# Patient Record
Sex: Female | Born: 1995 | Hispanic: No | Marital: Single | Smoking: Former smoker
Health system: Southern US, Community
[De-identification: ages and names within clinical notes are randomized; demographics above are authoritative.]

---
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
## 2017-01-16 ENCOUNTER — Other Ambulatory Visit: Payer: Self-pay | Admitting: Family Medicine

## 2017-01-24 ENCOUNTER — Other Ambulatory Visit: Payer: Self-pay | Admitting: Family Medicine

## 2017-02-06 ENCOUNTER — Other Ambulatory Visit: Payer: Self-pay | Admitting: Family Medicine

## 2017-02-13 ENCOUNTER — Telehealth: Payer: Self-pay | Admitting: Family Medicine

## 2017-02-13 NOTE — Telephone Encounter (Signed)
I called pt and talk to step mom to let her know that pt needs to call our office for a F/u appt with Dr.Metheney

## 2017-03-20 ENCOUNTER — Telehealth: Payer: Self-pay | Admitting: Family Medicine

## 2017-03-20 NOTE — Telephone Encounter (Signed)
Called pt and mom states she will have her to call us to schedule her f/u appt with Dr.Metheney

## 2017-03-29 ENCOUNTER — Encounter: Payer: Self-pay | Admitting: Family Medicine

## 2017-03-29 ENCOUNTER — Ambulatory Visit (INDEPENDENT_AMBULATORY_CARE_PROVIDER_SITE_OTHER): Payer: Commercial Managed Care - PPO | Admitting: Family Medicine

## 2017-03-29 ENCOUNTER — Ambulatory Visit: Payer: Managed Care, Other (non HMO) | Admitting: Family Medicine

## 2017-03-29 VITALS — BP 132/71 | HR 74 | Ht 62.0 in | Wt 120.0 lb

## 2017-03-29 DIAGNOSIS — F332 Major depressive disorder, recurrent severe without psychotic features: Secondary | ICD-10-CM

## 2017-03-29 DIAGNOSIS — F411 Generalized anxiety disorder: Secondary | ICD-10-CM | POA: Diagnosis not present

## 2017-03-29 MED ORDER — PAROXETINE HCL 30 MG PO TABS: 30.0000 mg | ORAL_TABLET | Freq: Every day | ORAL | 1 refills | Status: DC

## 2017-03-29 MED ORDER — PAROXETINE HCL 20 MG PO TABS: 20.0000 mg | ORAL_TABLET | Freq: Every day | ORAL | 1 refills | Status: DC

## 2017-03-29 NOTE — Progress Notes (Signed)
   Subjective:    Patient ID: Joy George, female    DOB: Dec 16, 1995, 21 y.o.   MRN: [id number]  HPI  Follow-up major depressive disorder and anxiety-currently on Paxil 20 mg daily. I have not seen her since June of this past year we started the medication. We were not going to refill her medication and how she made an appointment. Overall she feels like she's doing well. She said she didn't come back in because she felt the medication was working. She now has noticed a little bit of weight gain. And she says if she misses the medication for more than 2 or 3 day she'll get symptoms shocking sensations in her head which remind her then to then take her medication. She did recently start a new job at [employer] which is going to be a little bit higher stress. She has noticed some increased irritability since starting that job. She reports that overall she feels like she sleeps well and she tries to get enough rest. She did end up doing some therapy/counseling with Santina Evans carrier. She said she went until about November and then felt like she was doing better and has not been back since. She did feel like it was somewhat helpful.  Review of Systems     Objective:   Physical Exam  Constitutional: She is oriented to person, place, and time. She appears well-developed and well-nourished.  HENT:  Head: Normocephalic and atraumatic.  Cardiovascular: Normal rate, regular rhythm and normal heart sounds.   Pulmonary/Chest: Effort normal and breath sounds normal.  Neurological: She is alert and oriented to person, place, and time.  Skin: Skin is warm and dry.  Psychiatric: She has a normal mood and affect. Her behavior is normal.          Assessment & Plan:  Major depressive disorder with anxiety-PHQ 9 score of 11 and GAD 7 score of 2. We discussed options. We'll go ahead and try increasing Paxil to 30 mg daily. I like to see her back in about 3 months. Call if any palms or concerns and  certainly recheck to her therapist if she notices that she struggling more especially with her new job.

## 2017-10-03 ENCOUNTER — Ambulatory Visit: Payer: Commercial Managed Care - PPO | Admitting: Physician Assistant

## 2017-10-03 ENCOUNTER — Other Ambulatory Visit: Payer: Self-pay | Admitting: Family Medicine

## 2017-10-10 ENCOUNTER — Encounter: Payer: Self-pay | Admitting: Physician Assistant

## 2017-10-10 ENCOUNTER — Ambulatory Visit (INDEPENDENT_AMBULATORY_CARE_PROVIDER_SITE_OTHER): Payer: Commercial Managed Care - PPO | Admitting: Physician Assistant

## 2017-10-10 VITALS — BP 124/72 | HR 74 | Temp 98.3°F | Wt 123.3 lb

## 2017-10-10 DIAGNOSIS — F17210 Nicotine dependence, cigarettes, uncomplicated: Secondary | ICD-10-CM | POA: Diagnosis not present

## 2017-10-10 DIAGNOSIS — Z789 Other specified health status: Secondary | ICD-10-CM

## 2017-10-10 DIAGNOSIS — J019 Acute sinusitis, unspecified: Secondary | ICD-10-CM | POA: Diagnosis not present

## 2017-10-10 DIAGNOSIS — R05 Cough: Secondary | ICD-10-CM

## 2017-10-10 DIAGNOSIS — R058 Other specified cough: Secondary | ICD-10-CM

## 2017-10-10 MED ORDER — AZITHROMYCIN 250 MG PO TABS: ORAL_TABLET | ORAL | 0 refills | Status: DC

## 2017-10-10 MED ORDER — CETIRIZINE HCL 10 MG PO TABS: 10.0000 mg | ORAL_TABLET | Freq: Every day | ORAL | 11 refills | Status: DC

## 2017-10-10 MED ORDER — IPRATROPIUM BROMIDE 0.06 % NA SOLN: 1.0000 | Freq: Four times a day (QID) | NASAL | 0 refills | Status: DC | PRN

## 2017-10-10 NOTE — Progress Notes (Signed)
HPI:                                                                Joy OppenheimShyanne George is a 21 y.o. female who [facility] [place] today for URI symptoms  URI   This is a new problem. Episode onset: x 4 weeks. The problem has been unchanged. There has been no fever. Associated symptoms include congestion, coughing, a plugged ear sensation, rhinorrhea, sneezing and a sore throat. Pertinent negatives include no abdominal pain, ear pain, headaches, sinus pain, vomiting or wheezing. Associated symptoms comments: + chills. She has tried nothing for the symptoms.     No past medical history on file. No past surgical history on file. Social History   Tobacco Use  . Smoking status: Current Some Day Smoker    Types: Cigarettes  . Smokeless tobacco: Never Used  . Tobacco comment: 1-3 month  Substance Use Topics  . Alcohol use: Not on file   family history is not on file.  ROS: negative except as noted in the HPI  Medications: Current Outpatient Medications  Medication Sig Dispense Refill  . etonogestrel (NEXPLANON) 68 MG IMPL implant by Subdermal route.    Marland Kitchen. PARoxetine (PAXIL) 30 MG tablet Take 1 tablet (30 mg total) by mouth daily. Due for follow up visit 30 tablet 0  . azithromycin (ZITHROMAX Z-PAK) 250 MG tablet Take 2 tablets (500 mg) on  Day 1,  followed by 1 tablet (250 mg) once daily on Days 2 through 5. 6 tablet 0  . cetirizine (ZYRTEC) 10 MG tablet Take 1 tablet (10 mg total) at bedtime by mouth. 30 tablet 11  . ipratropium (ATROVENT) 0.06 % nasal spray Place 1 spray 4 (four) times daily as needed into both nostrils. 15 mL 0   No current facility-administered medications for this visit.    No Known Allergies     Objective:  BP 124/72   Pulse 74   Temp 98.3 F (36.8 C)   Wt 123 lb 5 oz (55.9 kg)   SpO2 99%   BMI 22.55 kg/m  Gen:  alert, not ill-appearing, no distress, appropriate for age HEENT: head normocephalic  without obvious abnormality, conjunctiva and cornea clear, TM's clear bilaterally, oropharynx clear, no exudates, uvula midline, nasal mucosa edematous, nares patent, no frontal or maxillary sinus tenderness, neck supple, no adenopathy, trachea midline Pulm: Normal work of breathing, normal phonation, clear to auscultation bilaterally, no wheezes, rales or rhonchi CV: Normal rate, regular rhythm, s1 and s2 distinct, no murmurs, clicks or rubs  Neuro: alert and oriented x 3, no tremor MSK: extremities atraumatic, normal gait and station Skin: intact, no rashes on exposed skin, no cyanosis   No results found for this or any previous visit (from the past 72 hour(s)). No results found.    Assessment and Plan: 21 y.o. female with   1. Acute non-recurrent sinusitis, unspecified location - 4 weeks of persistent symptoms, will treat empirically with Azithromycin to cover pulmonary symptoms. Symptomatic management with oral decongestant, intranasal decongestant/rinses - cetirizine (ZYRTEC) 10 MG tablet; Take 1 tablet (10 mg total) at bedtime by mouth.  Dispense: 30 tablet; Refill: 11 - azithromycin (ZITHROMAX Z-PAK) 250 MG tablet; Take 2 tablets (500 mg) on  Day 1,  followed by 1 tablet (250 mg) once daily on Days 2 through 5.  Dispense: 6 tablet; Refill: 0 - ipratropium (ATROVENT) 0.06 % nasal spray; Place 1 spray 4 (four) times daily as needed into both nostrils.  Dispense: 15 mL; Refill: 0  2. Electronic cigarette use   3. Light tobacco smoker <10 cigarettes per day   4. Cough present for greater than 3 weeks - limit vaping/tobacco use - lungs CTA, no fever, tachycardia or indication for chest x-ray today. She will be covered for CAP and pertussis with Azithromycin. Follow-up if cough persists.  Patient education and anticipatory guidance given Patient agrees with treatment plan Follow-up as needed if symptoms worsen or fail to improve  Levonne Hubertharley E. Teigan Manner PA-C

## 2017-10-10 NOTE — Patient Instructions (Signed)
- antibiotic x 5 days as prescribed - start Zyrtec/Cetirizine at bedtime - Atrovent nasal spray up to 4 times daily - Netty pot / nasal saline rinse - Sudafed for nasal congestion    Sinusitis, Adult Sinusitis is soreness and inflammation of your sinuses. Sinuses are hollow spaces in the bones around your face. Your sinuses are located:  Around your eyes.  In the middle of your forehead.  Behind your nose.  In your cheekbones.  Your sinuses and nasal passages are lined with a stringy fluid (mucus). Mucus normally drains out of your sinuses. When your nasal tissues become inflamed or swollen, the mucus can become trapped or blocked so air cannot flow through your sinuses. This allows bacteria, viruses, and funguses to grow, which leads to infection. Sinusitis can develop quickly and last for 7?10 days (acute) or for more than 12 weeks (chronic). Sinusitis often develops after a cold. What are the causes? This condition is caused by anything that creates swelling in the sinuses or stops mucus from draining, including:  Allergies.  Asthma.  Bacterial or viral infection.  Abnormally shaped bones between the nasal passages.  Nasal growths that contain mucus (nasal polyps).  Narrow sinus openings.  Pollutants, such as chemicals or irritants in the air.  A foreign object stuck in the nose.  A fungal infection. This is rare.  What increases the risk? The following factors may make you more likely to develop this condition:  Having allergies or asthma.  Having had a recent cold or respiratory tract infection.  Having structural deformities or blockages in your nose or sinuses.  Having a weak immune system.  Doing a lot of swimming or diving.  Overusing nasal sprays.  Smoking.  What are the signs or symptoms? The main symptoms of this condition are pain and a feeling of pressure around the affected sinuses. Other symptoms include:  Upper  toothache.  Earache.  Headache.  Bad breath.  Decreased sense of smell and taste.  A cough that may get worse at night.  Fatigue.  Fever.  Thick drainage from your nose. The drainage is often green and it may contain pus (purulent).  Stuffy nose or congestion.  Postnasal drip. This is when extra mucus collects in the throat or back of the nose.  Swelling and warmth over the affected sinuses.  Sore throat.  Sensitivity to light.  How is this diagnosed? This condition is diagnosed based on symptoms, a medical history, and a physical exam. To find out if your condition is acute or chronic, your health care provider may:  Look in your nose for signs of nasal polyps.  Tap over the affected sinus to check for signs of infection.  View the inside of your sinuses using an imaging device that has a light attached (endoscope).  If your health care provider suspects that you have chronic sinusitis, you may also:  Be tested for allergies.  Have a sample of mucus taken from your nose (nasal culture) and checked for bacteria.  Have a mucus sample examined to see if your sinusitis is related to an allergy.  If your sinusitis does not respond to treatment and it lasts longer than 8 weeks, you may have an MRI or CT scan to check your sinuses. These scans also help to determine how severe your infection is. In rare cases, a bone biopsy may be done to rule out more serious types of fungal sinus disease. How is this treated? Treatment for sinusitis depends on the  cause and whether your condition is chronic or acute. If a virus is causing your sinusitis, your symptoms will go away on their own within 10 days. You may be given medicines to relieve your symptoms, including:  Topical nasal decongestants. They shrink swollen nasal passages and let mucus drain from your sinuses.  Antihistamines. These drugs block inflammation that is triggered by allergies. This can help to ease swelling in  your nose and sinuses.  Topical nasal corticosteroids. These are nasal sprays that ease inflammation and swelling in your nose and sinuses.  Nasal saline washes. These rinses can help to get rid of thick mucus in your nose.  If your condition is caused by bacteria, you will be given an antibiotic medicine. If your condition is caused by a fungus, you will be given an antifungal medicine. Surgery may be needed to correct underlying conditions, such as narrow nasal passages. Surgery may also be needed to remove polyps. Follow these instructions at home: Medicines  Take, use, or apply over-the-counter and prescription medicines only as told by your health care provider. These may include nasal sprays.  If you were prescribed an antibiotic medicine, take it as told by your health care provider. Do not stop taking the antibiotic even if you start to feel better. Hydrate and Humidify  Drink enough water to keep your urine clear or pale yellow. Staying hydrated will help to thin your mucus.  Use a cool mist humidifier to keep the humidity level in your home above 50%.  Inhale steam for 10-15 minutes, 3-4 times a day or as told by your health care provider. You can do this in the bathroom while a hot shower is running.  Limit your exposure to cool or dry air. Rest  Rest as much as possible.  Sleep with your head raised (elevated).  Make sure to get enough sleep each night. General instructions  Apply a warm, moist washcloth to your face 3-4 times a day or as told by your health care provider. This will help with discomfort.  Wash your hands often with soap and water to reduce your exposure to viruses and other germs. If soap and water are not available, use hand sanitizer.  Do not smoke. Avoid being around people who are smoking (secondhand smoke).  Keep all follow-up visits as told by your health care provider. This is important. Contact a health care provider if:  You have a  fever.  Your symptoms get worse.  Your symptoms do not improve within 10 days. Get help right away if:  You have a severe headache.  You have persistent vomiting.  You have pain or swelling around your face or eyes.  You have vision problems.  You develop confusion.  Your neck is stiff.  You have trouble breathing. This information is not intended to replace advice given to you by your health care provider. Make sure you discuss any questions you have with your health care provider. Document Released: 11/19/2005 Document Revised: 07/15/2016 Document Reviewed: 09/14/2015 Elsevier Interactive Patient Education  2017 [employer].

## 2017-10-30 ENCOUNTER — Other Ambulatory Visit: Payer: Self-pay | Admitting: *Deleted

## 2017-10-30 DIAGNOSIS — F411 Generalized anxiety disorder: Secondary | ICD-10-CM

## 2017-10-30 DIAGNOSIS — F332 Major depressive disorder, recurrent severe without psychotic features: Secondary | ICD-10-CM

## 2017-10-30 MED ORDER — PAROXETINE HCL 30 MG PO TABS: 30.0000 mg | ORAL_TABLET | Freq: Every day | ORAL | 0 refills | Status: DC

## 2017-12-13 ENCOUNTER — Telehealth: Payer: Self-pay | Admitting: Family Medicine

## 2017-12-13 NOTE — Telephone Encounter (Signed)
Calle pt and left a message with her mom to let her know to call the office and schedule a F/u appt with Dr.Metheney Patient is due for a F/U on mood

## 2018-03-13 ENCOUNTER — Ambulatory Visit (INDEPENDENT_AMBULATORY_CARE_PROVIDER_SITE_OTHER): Payer: Commercial Managed Care - PPO | Admitting: Family Medicine

## 2018-03-13 ENCOUNTER — Encounter: Payer: Self-pay | Admitting: Family Medicine

## 2018-03-13 VITALS — BP 124/61 | HR 76 | Ht 62.0 in | Wt 123.0 lb

## 2018-03-13 DIAGNOSIS — F411 Generalized anxiety disorder: Secondary | ICD-10-CM

## 2018-03-13 DIAGNOSIS — F332 Major depressive disorder, recurrent severe without psychotic features: Secondary | ICD-10-CM

## 2018-03-13 DIAGNOSIS — M549 Dorsalgia, unspecified: Secondary | ICD-10-CM | POA: Diagnosis not present

## 2018-03-13 DIAGNOSIS — Z113 Encounter for screening for infections with a predominantly sexual mode of transmission: Secondary | ICD-10-CM

## 2018-03-13 MED ORDER — ESCITALOPRAM OXALATE 10 MG PO TABS: 10.0000 mg | ORAL_TABLET | Freq: Every day | ORAL | 1 refills | Status: DC

## 2018-03-13 NOTE — Patient Instructions (Addendum)
Cut fluoxetine in half.  Take a half tab daily for 1 week, then drop down to half tab daily every other day for 1 week.  Then stop and start the Lexapro the next day.  Recommend a trial of Aleve, 1 tab twice a day for the next 4 days.  Recommend using heat for 5-10 minutes and then doing her stretches twice a day.  If your back is not improving over the next 3-4 weeks then please come back in.

## 2018-03-13 NOTE — Progress Notes (Signed)
Subjective:    Patient ID: Joy George, female    DOB: 12/29/1995, 22 y.o.   MRN: [id number]  HPI Follow-up major depressive disorder/anxiety-only on Paxil 30 mg daily. She has been having some electric shock sensation.  She says it make her feel tired.  She reports still little interest and pleasure doing things several days of the week but denies feeling depressed or hopeless.  She does complain of low energy and difficulty concentrating as well as feeling nervous several days of the week.  She also complains of high levels of irritability and rates her symptoms is somewhat difficult.  She also complains of some mid back pain worse on the right compared to the left but mostly over the spine itself.  No recent injury or trauma.  No radiation of pain down the back or into the legs.  She does sit a lot at her job but is up and down to.  She works in a [employer], [employer] office.  She has not tried any medications and she has not tried any heat or ice for it.   Review of Systems  BP 124/61   Pulse 76   Ht 5\' 2"  (1.575 m)   Wt 123 lb (55.8 kg)   SpO2 100%   BMI 22.50 kg/m     No Known Allergies  No past medical history on file.  No past surgical history on file.  Social History   Socioeconomic History  . Marital status: Single    Spouse name: Not on file  . Number of children: Not on file  . Years of education: Not on file  . Highest education level: Not on file  Occupational History  . Not on file  Social Needs  . Financial resource strain: Not on file  . Food insecurity:    Worry: Not on file    Inability: Not on file  . Transportation needs:    Medical: Not on file    Non-medical: Not on file  Tobacco Use  . Smoking status: Current Some Day Smoker    Types: Cigarettes  . Smokeless tobacco: Never Used  . Tobacco comment: 1-3 month  Substance and Sexual Activity  . Alcohol use: Not on file  . Drug use: Not on file  . Sexual activity: Not on file  Lifestyle  . Physical  activity:    Days per week: Not on file    Minutes per session: Not on file  . Stress: Not on file  Relationships  . Social connections:    Talks on phone: Not on file    Gets together: Not on file    Attends religious service: Not on file    Active member of club or organization: Not on file    Attends meetings of clubs or organizations: Not on file    Relationship status: Not on file  . Intimate partner violence:    Fear of current or ex partner: Not on file    Emotionally abused: Not on file    Physically abused: Not on file    Forced sexual activity: Not on file  Other Topics Concern  . Not on file  Social History Narrative  . Not on file    No family history on file.  Outpatient Encounter Medications as of 20/10/2018  Medication Sig  . etonogestrel (NEXPLANON) 68 MG IMPL implant by Subdermal route.  . [DISCONTINUED] PARoxetine (PAXIL) 30 MG tablet Take 1 tablet (30 mg total) by mouth daily. LAST REFILL. PLEASE CALL  OFFICE TO SCHEDULE AN APPOINTMENT  . escitalopram (LEXAPRO) 10 MG tablet Take 1 tablet (10 mg total) by mouth daily.  . [DISCONTINUED] cetirizine (ZYRTEC) 10 MG tablet Take 1 tablet (10 mg total) at bedtime by mouth.  . [DISCONTINUED] ipratropium (ATROVENT) 0.06 % nasal spray Place 1 spray 4 (four) times daily as needed into both nostrils.   No facility-administered encounter medications on file as of 03/13/2018.          Objective:   Physical Exam  Constitutional: She is oriented to person, place, and time. She appears well-developed and well-nourished.  HENT:  Head: Normocephalic and atraumatic.  Cardiovascular: Normal rate, regular rhythm and normal heart sounds.  Pulmonary/Chest: Effort normal and breath sounds normal.  Neurological: She is alert and oriented to person, place, and time.  Skin: Skin is warm and dry.  Psychiatric: She has a normal mood and affect. Her behavior is normal.    Musculoskeletal: Normal flexion, extension, rotation and  side bending of the spine.  Nontender over the lower thoracic or lumbar spine.  Nontender over the paraspinous muscles but tender just a little bit on the right musculature of the mid back.  Negative straight leg raise.  Patellar reflexes 2+.      Assessment & Plan:  Depression/anxiety-PHQ 9 score of 14 and gad 7 score of 4.  We discussed options.  I think would be best if we just discontinue the Paxil and try something different.  The Paxil is the only medication she is tried so far.  Will start Lexapro.  Mid back pain-no recent trauma or injury.  May be secondary to prolonged sitting.  Discussed anti-inflammatory, heat, and stretches.  If not improving then please let us know and will consider further evaluation with possible x-ray.  Urine for GD/chlamy testing.

## 2018-03-14 LAB — C. TRACHOMATIS/N. GONORRHOEAE RNA
C. trachomatis RNA, TMA: NOT DETECTED
N. GONORRHOEAE RNA, TMA: NOT DETECTED

## 2018-04-04 ENCOUNTER — Other Ambulatory Visit: Payer: Self-pay | Admitting: Family Medicine

## 2018-04-24 ENCOUNTER — Ambulatory Visit: Payer: Commercial Managed Care - PPO | Admitting: Family Medicine

## 2018-05-21 ENCOUNTER — Ambulatory Visit (INDEPENDENT_AMBULATORY_CARE_PROVIDER_SITE_OTHER): Payer: Commercial Managed Care - PPO | Admitting: Family Medicine

## 2018-05-21 ENCOUNTER — Encounter: Payer: Self-pay | Admitting: Family Medicine

## 2018-05-21 VITALS — BP 125/58 | HR 91 | Ht 62.0 in | Wt 125.0 lb

## 2018-05-21 DIAGNOSIS — F411 Generalized anxiety disorder: Secondary | ICD-10-CM

## 2018-05-21 DIAGNOSIS — F332 Major depressive disorder, recurrent severe without psychotic features: Secondary | ICD-10-CM

## 2018-05-21 MED ORDER — SERTRALINE HCL 50 MG PO TABS: ORAL_TABLET | ORAL | 0 refills | Status: DC

## 2018-05-21 NOTE — Progress Notes (Signed)
   Subjective:    Patient ID: Joy George, female    DOB: 07/25/1996, 22 y.o.   MRN: [id number]  HPI    Follow up major depressive disorder and anxiety-she really is still struggling overall.  We recently discontinued her Paxil she was having some side effects from the medication and it really was not completely controlling her symptoms.  We decided to try Lexapro instead.  She is also getting electric shock sensations in her head.  She said she still got some on the Lexapro and she did try taking it before about 4 weeks but felt like her depressive thoughts were actually more intense particularly when she was by herself and it was quiet and she was able to lay down.  She feels like she is been sleeping okay but does sometimes has a hard time falling asleep because her mind is racing.  Reports feeling down depressed and hopeless nearly every day and decreased pleasure in doing things more than half the days.  She has had thoughts of being better off dead more than half the days as well.  She rates her symptoms is somewhat difficult.  She is also had problems controlling her worry and feeling a little bit more nervous and on edge.  She is willing to try a new medication.   Review of Systems     Objective:   Physical Exam  Constitutional: She is oriented to person, place, and time. She appears well-developed and well-nourished.  HENT:  Head: Normocephalic and atraumatic.  Cardiovascular: Normal rate, regular rhythm and normal heart sounds.  Pulmonary/Chest: Effort normal and breath sounds normal.  Neurological: She is alert and oriented to person, place, and time.  Skin: Skin is warm and dry.  Psychiatric: She has a normal mood and affect. Her behavior is normal.        Assessment & Plan:  MDD with ANxiety  -aggressive thoughts actually increased with Lexapro.  Side effects with Paxil.  She has not tried anything else.  So we will start with sertraline.  We will have her take a half a tab  daily for the first 6 days and then go up to whole tab.  If she is doing well and wants to increase to 2 tabs after 2 weeks she is welcome to do so but otherwise I will see her back in 1 month.  If she feels like her symptoms are not improving or she feels like she is actually emotionally feeling more depressed or worse on the medication please call us immediately.  She did sign up for my chart while she was here today.  I be happy to refer to psychiatry as we may need to consider consultation with them.  We also discussed the option of using a mood stabilizer at bedtime to help with symptoms if needed.  We could always start with some Seroquel but did warn about potential for sedation.  She wants to hold off and just try one medication at a time which I think is perfectly reasonable.

## 2018-06-13 ENCOUNTER — Other Ambulatory Visit: Payer: Self-pay | Admitting: Family Medicine

## 2018-07-12 ENCOUNTER — Other Ambulatory Visit: Payer: Self-pay | Admitting: Family Medicine

## 2018-07-18 ENCOUNTER — Telehealth: Payer: Self-pay | Admitting: Family Medicine

## 2018-07-18 NOTE — Telephone Encounter (Signed)
lvm asking pt to call back to clarify which medication she is taking.Joy George.Joy George, Joy George, CMA

## 2018-07-18 NOTE — Telephone Encounter (Signed)
Please call patient and clarify what she is doing with her medication.  We had switched her to Zoloft because she was having some irritability on Lexapro.  But we received a refill request for the Lexapro about a week ago.  We did refill it not realizing that that was an error.  So I just wanted to clarify which when she is actually taking.  And if she is not taking the Lexapro then we need to call and cancel the prescription.

## 2018-07-18 NOTE — Telephone Encounter (Signed)
Left VM for Pt to return clinic call.  

## 2018-07-25 NOTE — Telephone Encounter (Signed)
lvm asking pt to rtn call in regards to medication.Joy PacasBarkley, Joy George

## 2018-08-08 NOTE — Telephone Encounter (Signed)
Pt has not returned call. Encounter closed.Joy George, [name]

## 2018-09-03 ENCOUNTER — Encounter: Payer: Self-pay | Admitting: Physician Assistant

## 2018-09-03 ENCOUNTER — Ambulatory Visit (INDEPENDENT_AMBULATORY_CARE_PROVIDER_SITE_OTHER): Payer: Commercial Managed Care - PPO | Admitting: Physician Assistant

## 2018-09-03 VITALS — BP 122/77 | HR 76 | Temp 98.8°F | Ht 62.0 in | Wt 122.0 lb

## 2018-09-03 DIAGNOSIS — H6991 Unspecified Eustachian tube disorder, right ear: Secondary | ICD-10-CM | POA: Diagnosis not present

## 2018-09-03 DIAGNOSIS — J069 Acute upper respiratory infection, unspecified: Secondary | ICD-10-CM | POA: Diagnosis not present

## 2018-09-03 MED ORDER — METHYLPREDNISOLONE 4 MG PO TBPK: ORAL_TABLET | ORAL | 0 refills | Status: DC

## 2018-09-03 MED ORDER — FLUTICASONE PROPIONATE 50 MCG/ACT NA SUSP: 2.0000 | Freq: Every day | NASAL | 6 refills | Status: DC

## 2018-09-03 NOTE — Patient Instructions (Signed)
Eustachian Tube Dysfunction The eustachian tube connects the middle ear to the back of the nose. It regulates air pressure in the middle ear by allowing air to move between the ear and nose. It also helps to drain fluid from the middle ear space. When the eustachian tube does not function properly, air pressure, fluid, or both can build up in the middle ear. Eustachian tube dysfunction can affect one or both ears. What are the causes? This condition happens when the eustachian tube becomes blocked or cannot open normally. This may result from:  Ear infections.  Colds and other upper respiratory infections.  Allergies.  Irritation, such as from cigarette smoke or acid from the stomach coming up into the esophagus (gastroesophageal reflux).  Sudden changes in air pressure, such as from descending in an airplane.  Abnormal growths in the nose or throat, such as nasal polyps, tumors, or enlarged tissue at the back of the throat (adenoids).  What increases the risk? This condition may be more likely to develop in people who smoke and people who are overweight. Eustachian tube dysfunction may also be more likely to develop in children, especially children who have:  Certain birth defects of the mouth, such as cleft palate.  Large tonsils and adenoids.  What are the signs or symptoms? Symptoms of this condition may include:  A feeling of fullness in the ear.  Ear pain.  Clicking or popping noises in the ear.  Ringing in the ear.  Hearing loss.  Loss of balance.  Symptoms may get worse when the air pressure around you changes, such as when you travel to an area of high elevation or fly on an airplane. How is this diagnosed? This condition may be diagnosed based on:  Your symptoms.  A physical exam of your ear, nose, and throat.  Tests, such as those that measure: ? The movement of your eardrum (tympanogram). ? Your hearing (audiometry).  How is this treated? Treatment  depends on the cause and severity of your condition. If your symptoms are mild, you may be able to relieve your symptoms by moving air into ("popping") your ears. If you have symptoms of fluid in your ears, treatment may include:  Decongestants.  Antihistamines.  Nasal sprays or ear drops that contain medicines that reduce swelling (steroids).  In some cases, you may need to have a procedure to drain the fluid in your eardrum (myringotomy). In this procedure, a small tube is placed in the eardrum to:  Drain the fluid.  Restore the air in the middle ear space.  Follow these instructions at home:  Take over-the-counter and prescription medicines only as told by your health care provider.  Use techniques to help pop your ears as recommended by your health care provider. These may include: ? Chewing gum. ? Yawning. ? Frequent, forceful swallowing. ? Closing your mouth, holding your nose closed, and gently blowing as if you are trying to blow air out of your nose.  Do not do any of the following until your health care provider approves: ? Travel to high altitudes. ? Fly in airplanes. ? Work in a pressurized cabin or room. ? Scuba dive.  Keep your ears dry. Dry your ears completely after showering or bathing.  Do not smoke.  Keep all follow-up visits as told by your health care provider. This is important. Contact a health care provider if:  Your symptoms do not go away after treatment.  Your symptoms come back after treatment.  You are   unable to pop your ears.  You have: ? A fever. ? Pain in your ear. ? Pain in your head or neck. ? Fluid draining from your ear.  Your hearing suddenly changes.  You become very dizzy.  You lose your balance. This information is not intended to replace advice given to you by your health care provider. Make sure you discuss any questions you have with your health care provider. Document Released: 12/16/2015 Document Revised: 04/26/2016  Document Reviewed: 12/08/2014 Elsevier Interactive Patient Education  2018 [employer] Upper Respiratory Infection, Adult Most upper respiratory infections (URIs) are caused by a virus. A URI affects the nose, throat, and upper air passages. The most common type of URI is often called "the common cold." Follow these instructions at home:  Take medicines only as told by your doctor.  Gargle warm saltwater or take cough drops to comfort your throat as told by your doctor.  Use a warm mist humidifier or inhale steam from a shower to increase air moisture. This may make it easier to breathe.  Drink enough fluid to keep your pee (urine) clear or pale yellow.  Eat soups and other clear broths.  Have a healthy diet.  Rest as needed.  Go back to work when your fever is gone or your doctor says it is okay. ? You may need to stay home longer to avoid giving your URI to others. ? You can also wear a face mask and wash your hands often to prevent spread of the virus.  Use your inhaler more if you have asthma.  Do not use any tobacco products, including cigarettes, chewing tobacco, or electronic cigarettes. If you need help quitting, ask your doctor. Contact a doctor if:  You are getting worse, not better.  Your symptoms are not helped by medicine.  You have chills.  You are getting more short of breath.  You have brown or red mucus.  You have yellow or brown discharge from your nose.  You have pain in your face, especially when you bend forward.  You have a fever.  You have puffy (swollen) neck glands.  You have pain while swallowing.  You have white areas in the back of your throat. Get help right away if:  You have very bad or constant: ? Headache. ? Ear pain. ? Pain in your forehead, behind your eyes, and over your cheekbones (sinus pain). ? Chest pain.  You have long-lasting (chronic) lung disease and any of the following: ? Wheezing. ? Long-lasting  cough. ? Coughing up blood. ? A change in your usual mucus.  You have a stiff neck.  You have changes in your: ? Vision. ? Hearing. ? Thinking. ? Mood. This information is not intended to replace advice given to you by your health care provider. Make sure you discuss any questions you have with your health care provider. Document Released: 05/07/2008 Document Revised: 07/22/2016 Document Reviewed: 02/24/2014 Elsevier Interactive Patient Education  2018 [employer].

## 2018-09-03 NOTE — Progress Notes (Signed)
   Subjective:    Patient ID: Joy George, female    DOB: 01/24/1996, 22 y.o.   MRN: [id number]  HPI Pt is a 22 yo female who presents to the clinic with URI symptoms for the last 5 days. Her right ear has been pretty painful but over last 24 hours improved some. She did not have any sinus pressure. She does have some nasal congestion. She has a dry cough. No fever, chills, or body aches. She has taken some OTC tylenol cold and cough with some relief.   .. Active Ambulatory Problems    Diagnosis Date Noted  . ADHD 09/26/2010  . BUNION 09/26/2010  . Finger injury 04/18/2016  . 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 GAD (generalized anxiety disorder) 05/28/2016  . Social anxiety disorder 05/28/2016  . Electronic cigarette use 10/10/2017  . Light tobacco smoker <10 cigarettes per day 10/10/2017   Resolved Ambulatory Problems    Diagnosis Date Noted  . No Resolved Ambulatory Problems   No Additional Past Medical History     Review of Systems  All other systems reviewed and are negative.      Objective:   Physical Exam  Constitutional: She is oriented to person, place, and time. She appears well-developed and well-nourished.  HENT:  Head: Normocephalic and atraumatic.  Right Ear: External ear normal.  Left Ear: External ear normal.  Mouth/Throat: Oropharynx is clear and moist.  Right TM- erythematous with effusion at base from 6oclock to 9oclock.  Left TM- slightly retracted.   No sinus tenderness to palpation.   Nasal turbinates red and swollen.   Eyes: Right eye exhibits discharge. Left eye exhibits discharge.  Watery discharge.   Cardiovascular: Normal rate and regular rhythm.  Pulmonary/Chest: Effort normal and breath sounds normal. She has no wheezes.  Lymphadenopathy:    She has no cervical adenopathy.  Neurological: She is alert and oriented to person, place, and time.  Skin: No rash noted.   Psychiatric: She has a normal mood and affect. Her behavior is normal.          Assessment & Plan:  Marland KitchenMarland KitchenLivie was seen today for ear pain.  Diagnoses and all orders for this visit:  Upper respiratory tract infection, unspecified type -     fluticasone (FLONASE) 50 MCG/ACT nasal spray; Place 2 sprays into both nostrils daily.  Eustachian tube disorder, right -     methylPREDNISolone (MEDROL DOSEPAK) 4 MG TBPK tablet; Take as directed by package insert. -     fluticasone (FLONASE) 50 MCG/ACT nasal spray; Place 2 sprays into both nostrils daily.   Discussed with patient appears like symptoms are improving. Likely viral and will run its course. She does appear to have some EtD, right. Start with flonase for next 3 days 2 sprays each nostril and then if needed could start medrol dose pack. If not improving or if symptoms worsening please call back and will send antibiotic. Her right ear if does not drain will likely form infection. HO given.

## 2018-09-08 ENCOUNTER — Other Ambulatory Visit: Payer: Self-pay | Admitting: Family Medicine

## 2018-10-05 ENCOUNTER — Other Ambulatory Visit: Payer: Self-pay | Admitting: Family Medicine

## 2019-01-24 ENCOUNTER — Other Ambulatory Visit: Payer: Self-pay | Admitting: Family Medicine

## 2019-02-22 ENCOUNTER — Other Ambulatory Visit: Payer: Self-pay | Admitting: Family Medicine

## 2019-03-18 ENCOUNTER — Other Ambulatory Visit: Payer: Self-pay | Admitting: Family Medicine

## 2019-03-22 ENCOUNTER — Other Ambulatory Visit: Payer: Self-pay | Admitting: Family Medicine

## 2019-09-01 ENCOUNTER — Other Ambulatory Visit: Payer: Self-pay | Admitting: Physician Assistant

## 2019-09-01 DIAGNOSIS — J069 Acute upper respiratory infection, unspecified: Secondary | ICD-10-CM

## 2019-09-01 DIAGNOSIS — H6991 Unspecified Eustachian tube disorder, right ear: Secondary | ICD-10-CM

## 2020-10-03 ENCOUNTER — Ambulatory Visit (INDEPENDENT_AMBULATORY_CARE_PROVIDER_SITE_OTHER): Payer: Commercial Managed Care - PPO | Admitting: Family Medicine

## 2020-10-03 ENCOUNTER — Encounter: Payer: Self-pay | Admitting: Family Medicine

## 2020-10-03 VITALS — BP 119/64 | HR 75 | Ht 62.0 in | Wt 103.0 lb

## 2020-10-03 DIAGNOSIS — Z23 Encounter for immunization: Secondary | ICD-10-CM | POA: Diagnosis not present

## 2020-10-03 DIAGNOSIS — Z13220 Encounter for screening for lipoid disorders: Secondary | ICD-10-CM | POA: Diagnosis not present

## 2020-10-03 DIAGNOSIS — R634 Abnormal weight loss: Secondary | ICD-10-CM | POA: Diagnosis not present

## 2020-10-03 DIAGNOSIS — F902 Attention-deficit hyperactivity disorder, combined type: Secondary | ICD-10-CM

## 2020-10-03 DIAGNOSIS — F411 Generalized anxiety disorder: Secondary | ICD-10-CM

## 2020-10-03 MED ORDER — AMPHETAMINE-DEXTROAMPHET ER 10 MG PO CP24: 10.0000 mg | ORAL_CAPSULE | Freq: Every morning | ORAL | 0 refills | Status: DC

## 2020-10-03 NOTE — Assessment & Plan Note (Signed)
She does rate moderately on the anxiety scale.  I do think it may be worth seeing how she does with her ADHD medication and then circling back around to see what anxiety symptoms and depression symptoms she still struggling with.  I am concerned that she has had feelings of feeling down as well as thoughts of being better off dead several days a week.

## 2020-10-03 NOTE — Assessment & Plan Note (Signed)
Discussed trying to get an adequate calorie intake and some strategies around that.  Recently she has added protein bars in the diet and that is in fact how she has gained back a couple pounds I think this is a great idea also getting protein drinks then mentally she may need to look for a more full-time job that allows her to actually have a lunch instead of just a 15-minute break.  We will continue to monitor very carefully.  In addition we will do some work-up including checking her thyroid and evaluate for anemia etc.  She will try to go for labs today.

## 2020-10-03 NOTE — Progress Notes (Signed)
Established Patient Office Visit  Subjective:  Patient ID: Joy George, female    DOB: 05/20/1996  Age: 24 y.o. MRN: [id number]  CC:  Chief Complaint  Patient presents with  . ADHD    would like to restart med  . Weight Loss    pt reports that she was 120 and has dropped down 103 and it has been hard for her to maintain   . Developmental Dysplasia of the Hip    HPI Joy George presents for some concerns.  She is interested in retrying ADHD medication she says she was diagnosed in elementary school and has not been on medications since she was in elementary school she has been able to function well but is really starting to wonder if that is affecting some of her mood issues.  She is not currently taking the Lexapro for her anxiety..  She stopped it.  She does not remember what she took years ago.  Really feels like maybe a lot of the anxiety is driven by not treating her ADHD.  She does have a GAD-7 score of 14 today and rates her symptoms as somewhat difficult.  She is also concerned because she is lost about 18 pounds since the beginning of the year.  In fact she got down to about 100 pounds and new that she needed to do better.  She says part of it is is that she does not really eat breakfast, she then works a 7-hour shift at [employer] and they are only allowed a 15-minute break and so she never finishes her lunch.  She has been trying to add in some protein bars and has been able to gain about 3 pounds back.  She denies any other symptoms such as abnormal fevers, night sweats etc.  No past medical history on file.  No past surgical history on file.  No family history on file.  Social History   Socioeconomic History  . Marital status: Single    Spouse name: Not on file  . Number of children: Not on file  . Years of education: Not on file  . Highest education level: Not on file  Occupational History  . Not on file  Tobacco Use  . Smoking status: Current Some Day  Smoker    Types: E-cigarettes  . Smokeless tobacco: Never Used  . Tobacco comment: 1-3 month  Vaping Use  . Vaping Use: Every day  Substance and Sexual Activity  . Alcohol use: Not on file  . Drug use: Not on file  . Sexual activity: Not on file  Other Topics Concern  . Not on file  Social History Narrative  . Not on file   Social Determinants of Health   Financial Resource Strain:   . Difficulty of Paying Living Expenses: Not on file  Food Insecurity:   . Worried About Charity fundraiser in the Last Year: Not on file  . Ran Out of Food in the Last Year: Not on file  Transportation Needs:   . Lack of Transportation (Medical): Not on file  . Lack of Transportation (Non-Medical): Not on file  Physical Activity:   . Days of Exercise per Week: Not on file  . Minutes of Exercise per Session: Not on file  Stress:   . Feeling of Stress : Not on file  Social Connections:   . Frequency of Communication with Friends and Family: Not on file  . Frequency of Social Gatherings with Friends and Family: Not  on file  . Attends Religious Services: Not on file  . Active Member of Clubs or Organizations: Not on file  . Attends Archivist Meetings: Not on file  . Marital Status: Not on file  Intimate Partner Violence:   . Fear of Current or Ex-Partner: Not on file  . Emotionally Abused: Not on file  . Physically Abused: Not on file  . Sexually Abused: Not on file    Outpatient Medications Prior to Visit  Medication Sig Dispense Refill  . etonogestrel (NEXPLANON) 21 MG IMPL implant by Subdermal route.    Marland Kitchen escitalopram (LEXAPRO) 10 MG tablet Take 1 tablet (10 mg total) by mouth daily. [place].MUST SCHEDULE/KEEP APPOINTMENT FOR REFILLS 30 tablet 0  . fluticasone (FLONASE) 50 MCG/ACT nasal spray Place 2 sprays into both nostrils daily. 16 g 6  . methylPREDNISolone (MEDROL DOSEPAK) 4 MG TBPK tablet Take as directed by package insert. 21 tablet 0  . sertraline (ZOLOFT)  50 MG tablet TAKE 1 TABLET TWICE A DAY 180 tablet 0   No facility-administered medications prior to visit.    No Known Allergies  ROS Review of Systems    Objective:    Physical Exam Constitutional:      Appearance: She is well-developed.  HENT:     Head: Normocephalic and atraumatic.  Cardiovascular:     Rate and Rhythm: Normal rate and regular rhythm.     Heart sounds: Normal heart sounds.  Pulmonary:     Effort: Pulmonary effort is normal.     Breath sounds: Normal breath sounds.  Skin:    General: Skin is warm and dry.  Neurological:     Mental Status: She is alert and oriented to person, place, and time.  Psychiatric:        Behavior: Behavior normal.     BP 119/64   Pulse 75   Ht _0  (1.575 m)   Wt 103 lb (46.7 kg)   SpO2 100%   BMI 18.84 kg/m  Wt Readings from Last 3 Encounters:  10/03/20 103 lb (46.7 kg)  09/03/18 122 lb (55.3 kg)  05/21/18 125 lb (56.7 kg) (44 %, Z= -0.16)*   * Growth percentiles are based on CDC (Girls, 2-20 Years) data.     Health Maintenance Due  Topic Date Due  . Hepatitis C Screening  Never done  . COVID-19 Vaccine (1) Never done  . HIV Screening  Never done  . PAP-Cervical Cytology Screening  Never done  . PAP SMEAR-Modifier  Never done  . INFLUENZA VACCINE  Never done    There are no preventive care reminders to display for this patient.  No results found for: TSH No results found for: WBC, HGB, HCT, MCV, PLT No results found for: NA, K, CHLORIDE, CO2, GLUCOSE, BUN, CREATININE, BILITOT, ALKPHOS, AST, ALT, PROT, ALBUMIN, CALCIUM, ANIONGAP, EGFR, GFR No results found for: CHOL No results found for: HDL No results found for: LDLCALC No results found for: TRIG No results found for: CHOLHDL No results found for: HGBA1C    Assessment & Plan:   Problem List Items Addressed This Visit      Other   GAD (generalized anxiety disorder)    She does rate moderately on the anxiety scale.  I do think it may be worth  seeing how she does with her ADHD medication and then circling back around to see what anxiety symptoms and depression symptoms she still struggling with.  I am concerned that she has  had feelings of feeling down as well as thoughts of being better off dead several days a week.      Attention deficit hyperactivity disorder (ADHD) - Primary    We discussed trial of medication.  Again she does not member what she took years ago so we will start with Adderall did warn about potential side effects including chest pain palpitations elevation in blood pressure, insomnia.  We will see her back in 3 to 4 weeks so that we can see how she is doing and make adjustments if needed we also discussed nonstimulant medications as an alternative if needed.  We will need to very carefully watch for weight loss and she is already struggling with this.      Relevant Orders   CBC   COMPLETE METABOLIC PANEL WITH GFR   Lipid panel   TSH   Abnormal weight loss    Discussed trying to get an adequate calorie intake and some strategies around that.  Recently she has added protein bars in the diet and that is in fact how she has gained back a couple pounds I think this is a great idea also getting protein drinks then mentally she may need to look for a more full-time job that allows her to actually have a lunch instead of just a 15-minute break.  We will continue to monitor very carefully.  In addition we will do some work-up including checking her thyroid and evaluate for anemia etc.  She will try to go for labs today.      Relevant Orders   CBC   COMPLETE METABOLIC PANEL WITH GFR   Lipid panel   TSH    Other Visit Diagnoses    Need for tetanus, diphtheria, and acellular pertussis (Tdap) vaccine in patient of adolescent age or older       Relevant Orders   Tdap vaccine greater than or equal to 7yo IM (Completed)   Screening, lipid       Relevant Orders   Lipid panel      PHQ-9 score of 10 and GAD-7 score of 14,  rates both as somewhat difficult.  Meds ordered this encounter  Medications  . amphetamine-dextroamphetamine (ADDERALL XR) 10 MG 24 hr capsule    Sig: Take 1 capsule (10 mg total) by mouth in the morning.    Dispense:  30 capsule    Refill:  0    Follow-up: Return in about 3 weeks (around 10/24/2020) for New start medication/ADHD.   I spent 30 minutes on the day of the encounter to include pre-visit record review, face-to-face time with the patient and post visit ordering of test.   Beatrice Lecher, MD

## 2020-10-03 NOTE — Assessment & Plan Note (Signed)
We discussed trial of medication.  Again she does not member what she took years ago so we will start with Adderall did warn about potential side effects including chest pain palpitations elevation in blood pressure, insomnia.  We will see her back in 3 to 4 weeks so that we can see how she is doing and make adjustments if needed we also discussed nonstimulant medications as an alternative if needed.  We will need to very carefully watch for weight loss and she is already struggling with this.

## 2020-10-04 LAB — COMPLETE METABOLIC PANEL WITH GFR
AG Ratio: 1.9 (calc) (ref 1.0–2.5)
ALT: 17 U/L (ref 6–29)
AST: 18 U/L (ref 10–30)
Albumin: 5 g/dL (ref 3.6–5.1)
Alkaline phosphatase (APISO): 55 U/L (ref 31–125)
BUN: 12 mg/dL (ref 7–25)
CO2: 25 mmol/L (ref 20–32)
Calcium: 9.9 mg/dL (ref 8.6–10.2)
Chloride: 102 mmol/L (ref 98–110)
Creat: 0.91 mg/dL (ref 0.50–1.10)
GFR, Est African American: 104 mL/min/{1.73_m2} (ref >=60)
GFR, Est Non African American: 90 mL/min/{1.73_m2} (ref >=60)
Globulin: 2.7 g/dL (calc) (ref 1.9–3.7)
Glucose, Bld: 77 mg/dL (ref 65–99)
Potassium: 4.3 mmol/L (ref 3.5–5.3)
Sodium: 136 mmol/L (ref 135–146)
Total Bilirubin: 1 mg/dL (ref 0.2–1.2)
Total Protein: 7.7 g/dL (ref 6.1–8.1)

## 2020-10-04 LAB — CBC
HCT: 44.6 % (ref 35.0–45.0)
Hemoglobin: 14.8 g/dL (ref 11.7–15.5)
MCH: 31.6 pg (ref 27.0–33.0)
MCHC: 33.2 g/dL (ref 32.0–36.0)
MCV: 95.1 fL (ref 80.0–100.0)
MPV: 9.5 fL (ref 7.5–12.5)
Platelets: 304 10*3/uL (ref 140–400)
RBC: 4.69 10*6/uL (ref 3.80–5.10)
RDW: 12 % (ref 11.0–15.0)
WBC: 8.9 10*3/uL (ref 3.8–10.8)

## 2020-10-04 LAB — LIPID PANEL
Cholesterol: 193 mg/dL (ref <200)
HDL: 68 mg/dL (ref >=50)
LDL Cholesterol (Calc): 109 mg/dL (calc) — ABNORMAL HIGH
Non-HDL Cholesterol (Calc): 125 mg/dL (calc) (ref <130)
Total CHOL/HDL Ratio: 2.8 (calc) (ref <5.0)
Triglycerides: 74 mg/dL (ref <150)

## 2020-10-04 LAB — TSH: TSH: 2.21 mIU/L

## 2020-10-06 ENCOUNTER — Telehealth: Payer: Self-pay

## 2020-10-06 NOTE — Telephone Encounter (Signed)
Patient's mother called stating that prior Berkley Harvey is needed for Adderall.

## 2020-10-10 NOTE — Telephone Encounter (Signed)
PA submitted and approved through cover my meds. Pharmacy notified.

## 2020-10-24 ENCOUNTER — Ambulatory Visit: Payer: Commercial Managed Care - PPO | Admitting: Family Medicine

## 2020-11-11 ENCOUNTER — Other Ambulatory Visit: Payer: Self-pay

## 2020-11-11 MED ORDER — AMPHETAMINE-DEXTROAMPHET ER 10 MG PO CP24
10.0000 mg | ORAL_CAPSULE | Freq: Every morning | ORAL | 0 refills | Status: DC
Start: 2020-11-11 — End: 2021-01-03

## 2020-11-11 NOTE — Telephone Encounter (Signed)
Kacee has a follow up appointment on 11/29/2020.

## 2020-11-29 ENCOUNTER — Ambulatory Visit (INDEPENDENT_AMBULATORY_CARE_PROVIDER_SITE_OTHER): Payer: Commercial Managed Care - PPO

## 2020-11-29 ENCOUNTER — Other Ambulatory Visit: Payer: Self-pay | Admitting: Family Medicine

## 2020-11-29 ENCOUNTER — Encounter: Payer: Self-pay | Admitting: Family Medicine

## 2020-11-29 ENCOUNTER — Ambulatory Visit (INDEPENDENT_AMBULATORY_CARE_PROVIDER_SITE_OTHER): Payer: Commercial Managed Care - PPO | Admitting: Family Medicine

## 2020-11-29 ENCOUNTER — Other Ambulatory Visit: Payer: Self-pay

## 2020-11-29 VITALS — BP 115/58 | HR 79 | Ht 62.0 in | Wt 103.0 lb

## 2020-11-29 DIAGNOSIS — R634 Abnormal weight loss: Secondary | ICD-10-CM | POA: Diagnosis not present

## 2020-11-29 DIAGNOSIS — F902 Attention-deficit hyperactivity disorder, combined type: Secondary | ICD-10-CM | POA: Diagnosis not present

## 2020-11-29 DIAGNOSIS — M25552 Pain in left hip: Secondary | ICD-10-CM

## 2020-11-29 DIAGNOSIS — F339 Major depressive disorder, recurrent, unspecified: Secondary | ICD-10-CM

## 2020-11-29 DIAGNOSIS — M25551 Pain in right hip: Secondary | ICD-10-CM | POA: Diagnosis not present

## 2020-11-29 DIAGNOSIS — F411 Generalized anxiety disorder: Secondary | ICD-10-CM | POA: Diagnosis not present

## 2020-11-29 DIAGNOSIS — R002 Palpitations: Secondary | ICD-10-CM

## 2020-11-29 MED ORDER — SERTRALINE HCL 50 MG PO TABS: ORAL_TABLET | ORAL | 1 refills | Status: DC

## 2020-11-29 NOTE — Progress Notes (Signed)
She reports having some positive effects w/Adderall  Mood booster More motivated and focused Less brain fog More active  Improved emotional and stress management Joy in doing hobbies again  Side effects  Dizziness(standing up to fast) Numbness in toes, mostly L foot that she had surgery on Trouble w/sleeping (goes to bed around 11 PM now she's up until 12 AM or later) Spasms in deltoid area bilaterally but mostly in L that lasts about 1 min Gets a headache when she doesn't take a dose  Increased HR(decreased caffeine intake) Increased anxiety.

## 2020-11-29 NOTE — Assessment & Plan Note (Signed)
Wt is stable form last OV today.  Will address anxiety.

## 2020-11-29 NOTE — Assessment & Plan Note (Signed)
She is interested in seeing therapist counselor.  Referral placed.

## 2020-11-29 NOTE — Assessment & Plan Note (Signed)
Recommend xrays to evaluate for premature OA

## 2020-11-29 NOTE — Assessment & Plan Note (Signed)
Discussed options including changing her medication bc of side effects. BP and pulse is OK. Weight is stable.  She feels she doesn't want to change her medication at this time.  F/U in 6-8 weeks.

## 2020-11-29 NOTE — Assessment & Plan Note (Addendum)
Discussed dx. PHQ-9 score of 10 and GAD-7 score of 7 today. Rates sxs as somewhat difficult.  She is interested in restarting medicatin for her anxiety.  She has taken sertraline and Paxil int he past.  She doesn't think the paxil worked well so would like to restart zoloft.  F/U in 6 weeks.

## 2020-11-29 NOTE — Progress Notes (Signed)
Established Patient Office Visit  Subjective:  Patient ID: Joy George, female    DOB: 27-Feb-1996  Age: 24 y.o. MRN: [id number]  CC:  Chief Complaint  Patient presents with  . ADD    HPI Joy George presents for ADD.   She reports having some positive effects w/Adderall  Mood booster More motivated and focused Less brain fog More active  Improved emotional and stress management Joy in doing hobbies again  Side effects  Dizziness(standing up to fast) Numbness in toes, mostly L foot that she had surgery on Trouble w/sleeping (goes to bed around 11 PM now she's up until 12 AM or later) Spasms in deltoid area bilaterally but mostly in L that lasts about 1 min Gets a headache when she doesn't take a dose  Increased HR(decreased caffeine intake) Increased anxiety.  C/O bilat hip pain. Worse with prolonged standing. Hx of hip dysplasia as an infact. Pain is mostly over iliac crests and the groin crease area.    History reviewed. No pertinent past medical history.  History reviewed. No pertinent surgical history.  History reviewed. No pertinent family history.  Social History   Socioeconomic History  . Marital status: Single    Spouse name: Not on file  . Number of children: Not on file  . Years of education: Not on file  . Highest education level: Not on file  Occupational History  . Not on file  Tobacco Use  . Smoking status: Current Some Day Smoker    Types: E-cigarettes  . Smokeless tobacco: Never Used  . Tobacco comment: 1-3 month  Vaping Use  . Vaping Use: Every day  Substance and Sexual Activity  . Alcohol use: Not on file  . Drug use: Not on file  . Sexual activity: Not on file  Other Topics Concern  . Not on file  Social History Narrative  . Not on file   Social Determinants of Health   Financial Resource Strain: Not on file  Food Insecurity: Not on file  Transportation Needs: Not on file  Physical Activity: Not on file  Stress: Not  on file  Social Connections: Not on file  Intimate Partner Violence: Not on file    Outpatient Medications Prior to Visit  Medication Sig Dispense Refill  . amphetamine-dextroamphetamine (ADDERALL XR) 10 MG 24 hr capsule Take 1 capsule (10 mg total) by mouth in the morning. 30 capsule 0  . etonogestrel (NEXPLANON) 68 MG IMPL implant by Subdermal route.     No facility-administered medications prior to visit.    No Known Allergies  ROS Review of Systems    Objective:    Physical Exam Constitutional:      Appearance: She is well-developed and well-nourished.  HENT:     Head: Normocephalic and atraumatic.  Cardiovascular:     Rate and Rhythm: Normal rate and regular rhythm.     Heart sounds: Normal heart sounds.  Pulmonary:     Effort: Pulmonary effort is normal.     Breath sounds: Normal breath sounds.  Skin:    General: Skin is warm and dry.  Neurological:     Mental Status: She is alert and oriented to person, place, and time.  Psychiatric:        Mood and Affect: Mood and affect normal.        Behavior: Behavior normal.     BP (!) 115/58   Pulse 79   Ht 5\' 2"  (1.575 m)   Wt 103 lb (46.7 kg)  LMP 11/27/2020   SpO2 100%   BMI 18.84 kg/m  Wt Readings from Last 3 Encounters:  11/29/20 103 lb (46.7 kg)  10/03/20 103 lb (46.7 kg)  09/03/18 122 lb (55.3 kg)     Health Maintenance Due  Topic Date Due  . Hepatitis C Screening  Never done  . HIV Screening  Never done  . PAP-Cervical Cytology Screening  Never done  . PAP SMEAR-Modifier  Never done    There are no preventive care reminders to display for this patient.  Lab Results  Component Value Date   TSH 2.21 10/03/2020   Lab Results  Component Value Date   WBC 8.9 10/03/2020   HGB 14.8 10/03/2020   HCT 44.6 10/03/2020   MCV 95.1 10/03/2020   PLT 304 10/03/2020   Lab Results  Component Value Date   NA 136 10/03/2020   K 4.3 10/03/2020   CO2 25 10/03/2020   GLUCOSE 77 10/03/2020   BUN 12  10/03/2020   CREATININE 0.91 10/03/2020   BILITOT 1.0 10/03/2020   AST 18 10/03/2020   ALT 17 10/03/2020   PROT 7.7 10/03/2020   CALCIUM 9.9 10/03/2020   Lab Results  Component Value Date   CHOL 193 10/03/2020   Lab Results  Component Value Date   HDL 68 10/03/2020   Lab Results  Component Value Date   LDLCALC 109 (H) 10/03/2020   Lab Results  Component Value Date   TRIG 74 10/03/2020   Lab Results  Component Value Date   CHOLHDL 2.8 10/03/2020   No results found for: HGBA1C    Assessment & Plan:   Problem List Items Addressed This Visit      Other   GAD (generalized anxiety disorder)    Discussed dx. PHQ-9 score of 10 and GAD-7 score of 7 today. Rates sxs as somewhat difficult.  She is interested in restarting medicatin for her anxiety.  She has taken sertraline and Paxil int he past.  She doesn't think the paxil worked well so would like to restart zoloft.  F/U in 6 weeks.       Relevant Medications   sertraline (ZOLOFT) 50 MG tablet   Other Relevant Orders   Ambulatory referral to Behavioral Health   Depression, recurrent [facility])    She is interested in seeing therapist counselor.  Referral placed.       Relevant Medications   sertraline (ZOLOFT) 50 MG tablet   Bilateral hip pain    Recommend xrays to evaluate for premature OA      Relevant Orders   DG Hip Unilat W OR W/O Pelvis 2-3 Views Right   Attention deficit hyperactivity disorder (ADHD) - Primary    Discussed options including changing her medication bc of side effects. BP and pulse is OK. Weight is stable.  She feels she doesn't want to change her medication at this time.  F/U in 6-8 weeks.        Abnormal weight loss    Wt is stable form last OV today.  Will address anxiety.         Other Visit Diagnoses    Palpitations          Palpitations - avoid caffeine with the stimulant. Warned can cause palpitation, inc HR and inc BP.    Meds ordered this encounter  Medications  . sertraline  (ZOLOFT) 50 MG tablet    Sig: Take 0.5 tablets (25 mg total) by mouth daily for 10 days, THEN 1 tablet (  50 mg total) daily for 20 days.    Dispense:  30 tablet    Refill:  1    Follow-up: Return in about 4 weeks (around 12/27/2020) for New start medication.    Joy Gasser, MD

## 2020-12-22 ENCOUNTER — Other Ambulatory Visit: Payer: Self-pay | Admitting: Family Medicine

## 2020-12-22 DIAGNOSIS — F411 Generalized anxiety disorder: Secondary | ICD-10-CM

## 2020-12-27 ENCOUNTER — Ambulatory Visit: Payer: Commercial Managed Care - PPO | Admitting: Family Medicine

## 2020-12-27 NOTE — Progress Notes (Deleted)
Established Patient Office Visit  Subjective:  Patient ID: Joy George, female    DOB: January 26, 1996  Age: 25 y.o. MRN: [id number]  CC: No chief complaint on file.   HPI [name] presents for new start medication.  Here for follow-up of depression/anxiety-after last saw her for her ADD we had discussed actually restarting medication.  I was concerned that the ADD medication was actually causing some significant weight loss.  No past medical history on file.  No past surgical history on file.  No family history on file.  Social History   Socioeconomic History  . Marital status: Single    Spouse name: Not on file  . Number of children: Not on file  . Years of education: Not on file  . Highest education level: Not on file  Occupational History  . Not on file  Tobacco Use  . Smoking status: Current Some Day Smoker    Types: E-cigarettes  . Smokeless tobacco: Never Used  . Tobacco comment: 1-3 month  Vaping Use  . Vaping Use: Every day  Substance and Sexual Activity  . Alcohol use: Not on file  . Drug use: Not on file  . Sexual activity: Not on file  Other Topics Concern  . Not on file  Social History Narrative  . Not on file   Social Determinants of Health   Financial Resource Strain: Not on file  Food Insecurity: Not on file  Transportation Needs: Not on file  Physical Activity: Not on file  Stress: Not on file  Social Connections: Not on file  Intimate Partner Violence: Not on file    Outpatient Medications Prior to Visit  Medication Sig Dispense Refill  . amphetamine-dextroamphetamine (ADDERALL XR) 10 MG 24 hr capsule Take 1 capsule (10 mg total) by mouth in the morning. 30 capsule 0  . etonogestrel (NEXPLANON) 68 MG IMPL implant by Subdermal route.    . sertraline (ZOLOFT) 50 MG tablet Take 0.5 tablets (25 mg total) by mouth daily for 10 days, THEN 1 tablet (50 mg total) daily for 20 days. 30 tablet 1   No facility-administered medications prior  to visit.    No Known Allergies  ROS Review of Systems    Objective:    Physical Exam  LMP 11/27/2020  Wt Readings from Last 3 Encounters:  11/29/20 103 lb (46.7 kg)  10/03/20 103 lb (46.7 kg)  09/03/18 122 lb (55.3 kg)     Health Maintenance Due  Topic Date Due  . Hepatitis C Screening  Never done  . HIV Screening  Never done  . PAP-Cervical Cytology Screening  Never done  . PAP SMEAR-Modifier  Never done    There are no preventive care reminders to display for this patient.  Lab Results  Component Value Date   TSH 2.21 10/03/2020   Lab Results  Component Value Date   WBC 8.9 10/03/2020   HGB 14.8 10/03/2020   HCT 44.6 10/03/2020   MCV 95.1 10/03/2020   PLT 304 10/03/2020   Lab Results  Component Value Date   NA 136 10/03/2020   K 4.3 10/03/2020   CO2 25 10/03/2020   GLUCOSE 77 10/03/2020   BUN 12 10/03/2020   CREATININE 0.91 10/03/2020   BILITOT 1.0 10/03/2020   AST 18 10/03/2020   ALT 17 10/03/2020   PROT 7.7 10/03/2020   CALCIUM 9.9 10/03/2020   Lab Results  Component Value Date   CHOL 193 10/03/2020   Lab Results  Component Value Date  HDL 68 10/03/2020   Lab Results  Component Value Date   LDLCALC 109 (H) 10/03/2020   Lab Results  Component Value Date   TRIG 74 10/03/2020   Lab Results  Component Value Date   CHOLHDL 2.8 10/03/2020   No results found for: HGBA1C    Assessment & Plan:   Problem List Items Addressed This Visit      Other   GAD (generalized anxiety disorder)   Depression, recurrent (HCC)   Attention deficit hyperactivity disorder (ADHD) - Primary      No orders of the defined types were placed in this encounter.   Follow-up: No follow-ups on file.    Nani Gasser, MD

## 2021-01-02 ENCOUNTER — Ambulatory Visit (INDEPENDENT_AMBULATORY_CARE_PROVIDER_SITE_OTHER): Payer: Commercial Managed Care - PPO | Admitting: Professional

## 2021-01-02 DIAGNOSIS — F900 Attention-deficit hyperactivity disorder, predominantly inattentive type: Secondary | ICD-10-CM

## 2021-01-02 DIAGNOSIS — F411 Generalized anxiety disorder: Secondary | ICD-10-CM

## 2021-01-03 ENCOUNTER — Other Ambulatory Visit: Payer: Self-pay

## 2021-01-03 NOTE — Telephone Encounter (Signed)
Rosalita is scheduled for February 15 th.

## 2021-01-04 MED ORDER — AMPHETAMINE-DEXTROAMPHET ER 10 MG PO CP24: 10.0000 mg | ORAL_CAPSULE | Freq: Every morning | ORAL | 0 refills | Status: DC

## 2021-01-17 ENCOUNTER — Other Ambulatory Visit: Payer: Self-pay

## 2021-01-17 ENCOUNTER — Ambulatory Visit (INDEPENDENT_AMBULATORY_CARE_PROVIDER_SITE_OTHER): Payer: Commercial Managed Care - PPO | Admitting: Family Medicine

## 2021-01-17 ENCOUNTER — Encounter: Payer: Self-pay | Admitting: Family Medicine

## 2021-01-17 VITALS — BP 123/69 | HR 82 | Ht 62.0 in | Wt 104.0 lb

## 2021-01-17 DIAGNOSIS — F411 Generalized anxiety disorder: Secondary | ICD-10-CM

## 2021-01-17 DIAGNOSIS — F339 Major depressive disorder, recurrent, unspecified: Secondary | ICD-10-CM | POA: Diagnosis not present

## 2021-01-17 DIAGNOSIS — F902 Attention-deficit hyperactivity disorder, combined type: Secondary | ICD-10-CM | POA: Diagnosis not present

## 2021-01-17 MED ORDER — SERTRALINE HCL 50 MG PO TABS: 50.0000 mg | ORAL_TABLET | Freq: Every day | ORAL | 0 refills | Status: DC

## 2021-01-17 MED ORDER — AMPHETAMINE-DEXTROAMPHET ER 10 MG PO CP24: 10.0000 mg | ORAL_CAPSULE | Freq: Every morning | ORAL | 0 refills | Status: DC

## 2021-01-17 NOTE — Assessment & Plan Note (Signed)
Doing well on current regimen.  Some of the problems she was having initially such as insomnia seem to have resolved themselves.  It does still cause some suppression of her appetite but she says she has been purposely trying to make sure she is eating and getting enough protein in we will definitely continue to monitor her weight carefully.  Her weight was at least stable from December to today.  We will continue current regimen went ahead and sent her refills for March.  I will see her back in 3 months.

## 2021-01-17 NOTE — Assessment & Plan Note (Addendum)
PHQ-9 score of 4 and GAD-7 score of 3 today.  Symptoms rated does not difficult.  Significant improvement from previous PHQ-9 of 9 and GAD-7 score of 14.  Is happy with the results so far and has not had any negative symptoms we will continue current regimen and plan to follow back up in 3 months to make sure she is still doing well and that we do not need to make any adjustments.  New prescription sent to pharmacy.

## 2021-01-17 NOTE — Progress Notes (Addendum)
Established Patient Office Visit  Subjective:  Patient ID: Joy George, female    DOB: 05-22-1996  Age: 25 y.o. MRN: [id number]  CC:  Chief Complaint  Patient presents with  . ADHD  . major depressive disorder    HPI Joy George presents for   ADHD - Reports symptoms are well controlled on current regime. Denies any problems with insomnia, chest pain, palpitations, or SOB.  She still struggles with low appetite on the medication but has been purposely trying to get enough protein in her diet to maintain her weight.  Follow-up depression/anxiety-she is currently on Zoloft, that was started at the end of December.  She has been on it for a little over 6 weeks now..  Does report still some issues with low energy and trouble concentrating as well as trouble relaxing.  We had placed a referral for therapy.  She actually feels like the medication has been helpful she has not noticed any significant negative side effects.  She feels like some things have clicked in place she thinks the combination of treating her ADD and her anxiety has been really helpful.  She says she has been doing much better with time management in general as well as just her personal outlook on things has really improved.  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  Also has a Nexplanon and which has now expired and needs to be removed.  She had it placed at [facility].    History reviewed. No pertinent past medical history.  History reviewed. No pertinent surgical history.  History reviewed. No pertinent family history.  Social History   Socioeconomic History  . Marital status: Single    Spouse name: Not on file  . Number of children: Not on file  . Years of education: Not on file  . Highest education level: Not on file  Occupational History   . Not on file  Tobacco Use  . Smoking status: Current Some Day Smoker    Types: E-cigarettes  . Smokeless tobacco: Never Used  . Tobacco comment: 1-3 month  Vaping Use  . Vaping Use: Every day  Substance and Sexual Activity  . Alcohol use: Not on file  . Drug use: Not on file  . Sexual activity: Not on file  Other Topics Concern  . Not on file  Social History Narrative  . Not on file   Social Determinants of Health   Financial Resource Strain: Not on file  Food Insecurity: Not on file  Transportation Needs: Not on file  Physical Activity: Not on file  Stress: Not on file  Social Connections: Not on file  Intimate Partner Violence: Not on file    Outpatient Medications Prior to Visit  Medication Sig Dispense Refill  . amphetamine-dextroamphetamine (ADDERALL XR) 10 MG 24 hr capsule Take 1 capsule (10 mg total) by mouth in the morning. 30 capsule 0  . sertraline (ZOLOFT) 50 MG tablet Take 0.5 tablets (25 mg total) by mouth daily for 10 days, THEN 1 tablet (50 mg total) daily for 20 days. 30 tablet 1  . etonogestrel (NEXPLANON) 68 MG IMPL implant by Subdermal route.     No facility-administered medications prior to visit.    No Known Allergies  ROS Review of Systems    Objective:  Physical Exam Constitutional:      Appearance: She is well-developed and well-nourished.  HENT:     Head: Normocephalic and atraumatic.  Cardiovascular:     Rate and Rhythm: Normal rate and regular rhythm.     Heart sounds: Normal heart sounds.  Pulmonary:     Effort: Pulmonary effort is normal.     Breath sounds: Normal breath sounds.  Skin:    General: Skin is warm and dry.  Neurological:     Mental Status: She is alert and oriented to person, place, and time.  Psychiatric:        Mood and Affect: Mood and affect normal.        Behavior: Behavior normal.     BP 123/69   Pulse 82   Ht 5\' 2"  (1.575 m)   Wt 104 lb (47.2 kg)   SpO2 95%   BMI 19.02 kg/m  Wt Readings from  Last 3 Encounters:  01/17/21 104 lb (47.2 kg)  11/29/20 103 lb (46.7 kg)  10/03/20 103 lb (46.7 kg)     Health Maintenance Due  Topic Date Due  . Hepatitis C Screening  Never done  . HIV Screening  Never done  . PAP-Cervical Cytology Screening  Never done  . PAP SMEAR-Modifier  Never done    There are no preventive care reminders to display for this patient.  Lab Results  Component Value Date   TSH 2.21 10/03/2020   Lab Results  Component Value Date   WBC 8.9 10/03/2020   HGB 14.8 10/03/2020   HCT 44.6 10/03/2020   MCV 95.1 10/03/2020   PLT 304 10/03/2020   Lab Results  Component Value Date   NA 136 10/03/2020   K 4.3 10/03/2020   CO2 25 10/03/2020   GLUCOSE 77 10/03/2020   BUN 12 10/03/2020   CREATININE 0.91 10/03/2020   BILITOT 1.0 10/03/2020   AST 18 10/03/2020   ALT 17 10/03/2020   PROT 7.7 10/03/2020   CALCIUM 9.9 10/03/2020   Lab Results  Component Value Date   CHOL 193 10/03/2020   Lab Results  Component Value Date   HDL 68 10/03/2020   Lab Results  Component Value Date   LDLCALC 109 (H) 10/03/2020   Lab Results  Component Value Date   TRIG 74 10/03/2020   Lab Results  Component Value Date   CHOLHDL 2.8 10/03/2020   No results found for: HGBA1C    Assessment & Plan:   Problem List Items Addressed This Visit      Other   GAD (generalized anxiety disorder)   Relevant Medications   sertraline (ZOLOFT) 50 MG tablet   Depression, recurrent (HCC)    PHQ-9 score of 4 and GAD-7 score of 3 today.  Symptoms rated does not difficult.  Significant improvement from previous PHQ-9 of 9 and GAD-7 score of 14.  Is happy with the results so far and has not had any negative symptoms we will continue current regimen and plan to follow back up in 3 months to make sure she is still doing well and that we do not need to make any adjustments.  New prescription sent to pharmacy.      Relevant Medications   sertraline (ZOLOFT) 50 MG tablet   Attention  deficit hyperactivity disorder (ADHD) - Primary    Doing well on current regimen.  Some of the problems she was having initially such as insomnia seem to have resolved themselves.  It does still cause some suppression  of her appetite but she says she has been purposely trying to make sure she is eating and getting enough protein in we will definitely continue to monitor her weight carefully.  Her weight was at least stable from December to today.  We will continue current regimen went ahead and sent her refills for March.  I will see her back in 3 months.         Meds ordered this encounter  Medications  . sertraline (ZOLOFT) 50 MG tablet    Sig: Take 1 tablet (50 mg total) by mouth daily.    Dispense:  90 tablet    Refill:  0  . amphetamine-dextroamphetamine (ADDERALL XR) 10 MG 24 hr capsule    Sig: Take 1 capsule (10 mg total) by mouth in the morning.    Dispense:  30 capsule    Refill:  0    Follow-up: Return in about 3 months (around 04/16/2021) for Mood.    Nani Gasser, MD

## 2021-01-26 ENCOUNTER — Ambulatory Visit: Payer: Commercial Managed Care - PPO | Admitting: Professional

## 2021-02-08 ENCOUNTER — Ambulatory Visit (INDEPENDENT_AMBULATORY_CARE_PROVIDER_SITE_OTHER): Payer: Commercial Managed Care - PPO | Admitting: Professional

## 2021-02-08 DIAGNOSIS — F411 Generalized anxiety disorder: Secondary | ICD-10-CM

## 2021-02-08 DIAGNOSIS — F900 Attention-deficit hyperactivity disorder, predominantly inattentive type: Secondary | ICD-10-CM | POA: Diagnosis not present

## 2021-03-06 ENCOUNTER — Ambulatory Visit: Payer: Commercial Managed Care - PPO | Admitting: Professional

## 2021-03-24 ENCOUNTER — Other Ambulatory Visit: Payer: Self-pay

## 2021-03-24 DIAGNOSIS — F902 Attention-deficit hyperactivity disorder, combined type: Secondary | ICD-10-CM

## 2021-03-24 MED ORDER — AMPHETAMINE-DEXTROAMPHET ER 10 MG PO CP24: 10.0000 mg | ORAL_CAPSULE | Freq: Every morning | ORAL | 0 refills | Status: DC

## 2021-03-24 NOTE — Telephone Encounter (Signed)
Pt called and requested a refill for Adderall, Pt last OV on 01/17/21. Refill pended.

## 2021-04-15 ENCOUNTER — Other Ambulatory Visit: Payer: Self-pay | Admitting: Family Medicine

## 2021-04-15 DIAGNOSIS — F411 Generalized anxiety disorder: Secondary | ICD-10-CM

## 2021-04-17 ENCOUNTER — Ambulatory Visit: Payer: Commercial Managed Care - PPO | Admitting: Family Medicine

## 2021-04-26 ENCOUNTER — Ambulatory Visit (INDEPENDENT_AMBULATORY_CARE_PROVIDER_SITE_OTHER): Payer: Commercial Managed Care - PPO | Admitting: Family Medicine

## 2021-04-26 ENCOUNTER — Encounter: Payer: Self-pay | Admitting: Family Medicine

## 2021-04-26 ENCOUNTER — Other Ambulatory Visit: Payer: Self-pay

## 2021-04-26 VITALS — BP 127/67 | HR 74 | Ht 62.0 in | Wt 108.0 lb

## 2021-04-26 DIAGNOSIS — S61217A Laceration without foreign body of left little finger without damage to nail, initial encounter: Secondary | ICD-10-CM | POA: Diagnosis not present

## 2021-04-26 DIAGNOSIS — F902 Attention-deficit hyperactivity disorder, combined type: Secondary | ICD-10-CM | POA: Diagnosis not present

## 2021-04-26 MED ORDER — AMPHETAMINE-DEXTROAMPHET ER 15 MG PO CP24: 15.0000 mg | ORAL_CAPSULE | Freq: Every morning | ORAL | 0 refills | Status: DC

## 2021-04-26 MED ORDER — AMPHETAMINE-DEXTROAMPHET ER 15 MG PO CP24: 15.0000 mg | ORAL_CAPSULE | ORAL | 0 refills | Status: DC

## 2021-04-26 NOTE — Assessment & Plan Note (Signed)
Discussed options.  We will go up to 15 mg on the Adderall XR.  Monitor for appetite suppression and side effects.  Call if any problems otherwise I will see her back in 2 months so that we can make sure she is tolerating it well and check her blood pressure and pulse at that time as well her weight.

## 2021-04-26 NOTE — Progress Notes (Signed)
Established Patient Office Visit  Subjective:  Patient ID: Joy George, female    DOB: [birth year]  Age: 25 y.o. MRN: [id number]  CC:  Chief Complaint  Patient presents with  . ADHD  . Laceration    L little finger     HPI Joy George presents for  ADHD - Reports symptoms are well controlled on current regimne.  She says that she really has noticed a huge difference in her ability to track things in consecutive order instead of feeling like her mind just bounces around.  She says she still struggling a little bit with paying attention and then remembering things so she would like to try higher dose of possible denies any pronblems with insomnia, chest pain, palpitations, or SOB.    She also cut her finger with a knife while at work yesterday on her left hand on her left finger.  She says been quite painful and thought last night it was throbbing.  She washed it immediately and has been keeping it covered.  He said it did quit bleeding.  History reviewed. No pertinent past medical history.  History reviewed. No pertinent surgical history.  History reviewed. No pertinent family history.  Social History   Socioeconomic History  . Marital status: Single    Spouse name: Not on file  . Number of children: Not on file  . Years of education: Not on file  . Highest education level: Not on file  Occupational History  . Not on file  Tobacco Use  . Smoking status: Current Some Day Smoker    Types: E-cigarettes  . Smokeless tobacco: Never Used  . Tobacco comment: 1-3 month  Vaping Use  . Vaping Use: Every day  Substance and Sexual Activity  . Alcohol use: Not on file  . Drug use: Not on file  . Sexual activity: Not on file  Other Topics Concern  . Not on file  Social History Narrative  . Not on file   Social Determinants of Health   Financial Resource Strain: Not on file  Food Insecurity: Not on file  Transportation Needs: Not on file  Physical Activity: Not on file   Stress: Not on file  Social Connections: Not on file  Intimate Partner Violence: Not on file    Outpatient Medications Prior to Visit  Medication Sig Dispense Refill  . etonogestrel (NEXPLANON) 68 MG IMPL implant by Subdermal route.    . sertraline (ZOLOFT) 50 MG tablet TAKE 1 TABLET BY MOUTH EVERY DAY 90 tablet 0  . amphetamine-dextroamphetamine (ADDERALL XR) 10 MG 24 hr capsule Take 1 capsule (10 mg total) by mouth in the morning. 30 capsule 0   No facility-administered medications prior to visit.    No Known Allergies  ROS Review of Systems    Objective:    Physical Exam Constitutional:      Appearance: She is well-developed.  HENT:     Head: Normocephalic and atraumatic.  Cardiovascular:     Rate and Rhythm: Normal rate and regular rhythm.     Heart sounds: Normal heart sounds.  Pulmonary:     Effort: Pulmonary effort is normal.     Breath sounds: Normal breath sounds.  Musculoskeletal:     Comments: Horizontal laceration over the palmar side at the end of the fifth finger on the left hand.  Incision is clean dry and intact.  It is approximately 1-1/2 cm  Skin:    General: Skin is warm and dry.  Neurological:  Mental Status: She is alert and oriented to person, place, and time.  Psychiatric:        Behavior: Behavior normal.     BP 127/67   Pulse 74   Ht 5\' 2"  (1.575 m)   Wt 108 lb (49 kg)   SpO2 100%   BMI 19.75 kg/m  Wt Readings from Last 3 Encounters:  04/26/21 108 lb (49 kg)  01/17/21 104 lb (47.2 kg)  11/29/20 103 lb (46.7 kg)     Health Maintenance Due  Topic Date Due  . HIV Screening  Never done  . Hepatitis C Screening  Never done  . PAP-Cervical Cytology Screening  Never done  . PAP SMEAR-Modifier  Never done  . COVID-19 Vaccine (3 - Booster for Pfizer series) 01/09/2021    There are no preventive care reminders to display for this patient.  Lab Results  Component Value Date   TSH 2.21 10/03/2020   Lab Results  Component  Value Date   WBC 8.9 10/03/2020   HGB 14.8 10/03/2020   HCT 44.6 10/03/2020   MCV 95.1 10/03/2020   PLT 304 10/03/2020   Lab Results  Component Value Date   NA 136 10/03/2020   K 4.3 10/03/2020   CO2 25 10/03/2020   GLUCOSE 77 10/03/2020   BUN 12 10/03/2020   CREATININE 0.91 10/03/2020   BILITOT 1.0 10/03/2020   AST 18 10/03/2020   ALT 17 10/03/2020   PROT 7.7 10/03/2020   CALCIUM 9.9 10/03/2020   Lab Results  Component Value Date   CHOL 193 10/03/2020   Lab Results  Component Value Date   HDL 68 10/03/2020   Lab Results  Component Value Date   LDLCALC 109 (H) 10/03/2020   Lab Results  Component Value Date   TRIG 74 10/03/2020   Lab Results  Component Value Date   CHOLHDL 2.8 10/03/2020   No results found for: HGBA1C    Assessment & Plan:   Problem List Items Addressed This Visit      Other   Attention deficit hyperactivity disorder (ADHD) - Primary    Discussed options.  We will go up to 15 mg on the Adderall XR.  Monitor for appetite suppression and side effects.  Call if any problems otherwise I will see her back in 2 months so that we can make sure she is tolerating it well and check her blood pressure and pulse at that time as well her weight.      Relevant Medications   amphetamine-dextroamphetamine (ADDERALL XR) 15 MG 24 hr capsule    Other Visit Diagnoses    Laceration of left little finger without foreign body without damage to nail, initial encounter         Laceration-wound cleaned.  It looks clean dry and intact.  No active bleeding.  Dermabond placed over the wound.  Patient tolerated well.  Keep covered if it is going to get wet or dirty.  Meds ordered this encounter  Medications  . DISCONTD: amphetamine-dextroamphetamine (ADDERALL XR) 15 MG 24 hr capsule    Sig: Take 1 capsule by mouth in the morning.    Dispense:  30 capsule    Refill:  0  . amphetamine-dextroamphetamine (ADDERALL XR) 15 MG 24 hr capsule    Sig: Take 1 capsule by  mouth every morning.    Dispense:  30 capsule    Refill:  0  . amphetamine-dextroamphetamine (ADDERALL XR) 15 MG 24 hr capsule    Sig: Take 1  capsule by mouth in the morning.    Dispense:  30 capsule    Refill:  0    Follow-up: Return in about 2 months (around 06/26/2021) for ADHD .    Nani Gasser, MD

## 2021-06-20 ENCOUNTER — Other Ambulatory Visit: Payer: Self-pay

## 2021-06-20 DIAGNOSIS — F902 Attention-deficit hyperactivity disorder, combined type: Secondary | ICD-10-CM

## 2021-06-20 MED ORDER — AMPHETAMINE-DEXTROAMPHET ER 15 MG PO CP24: 15.0000 mg | ORAL_CAPSULE | ORAL | 0 refills | Status: DC

## 2021-06-20 NOTE — Telephone Encounter (Signed)
Pt called a asked for refill on her Adderall. Refill pended.

## 2021-06-20 NOTE — Telephone Encounter (Signed)
Meds ordered this encounter  Medications   amphetamine-dextroamphetamine (ADDERALL XR) 15 MG 24 hr capsule    Sig: Take 1 capsule by mouth every morning.    Dispense:  30 capsule    Refill:  0    

## 2021-06-26 ENCOUNTER — Ambulatory Visit: Payer: Commercial Managed Care - PPO | Admitting: Family Medicine

## 2021-06-26 NOTE — Progress Notes (Deleted)
Established Patient Office Visit  Subjective:  Patient ID: Joy George, female    DOB: 1996-07-21  Age: 25 y.o. MRN: [id number]  CC: No chief complaint on file.   HPI [name] presents for ADHD.   ADD - Reports symptoms are well controlled on current regime. Denies any problems with insomnia, chest pain, palpitations, or SOB.     No past medical history on file.  No past surgical history on file.  No family history on file.  Social History   Socioeconomic History   Marital status: Single    Spouse name: Not on file   Number of children: Not on file   Years of education: Not on file   Highest education level: Not on file  Occupational History   Not on file  Tobacco Use   Smoking status: Some Days    Types: E-cigarettes   Smokeless tobacco: Never   Tobacco comments:    1-3 month  Vaping Use   Vaping Use: Every day  Substance and Sexual Activity   Alcohol use: Not on file   Drug use: Not on file   Sexual activity: Not on file  Other Topics Concern   Not on file  Social History Narrative   Not on file   Social Determinants of Health   Financial Resource Strain: Not on file  Food Insecurity: Not on file  Transportation Needs: Not on file  Physical Activity: Not on file  Stress: Not on file  Social Connections: Not on file  Intimate Partner Violence: Not on file    Outpatient Medications Prior to Visit  Medication Sig Dispense Refill   amphetamine-dextroamphetamine (ADDERALL XR) 15 MG 24 hr capsule Take 1 capsule by mouth in the morning. 30 capsule 0   amphetamine-dextroamphetamine (ADDERALL XR) 15 MG 24 hr capsule Take 1 capsule by mouth every morning. 30 capsule 0   etonogestrel (NEXPLANON) 68 MG IMPL implant by Subdermal route.     sertraline (ZOLOFT) 50 MG tablet TAKE 1 TABLET BY MOUTH EVERY DAY 90 tablet 0   No facility-administered medications prior to visit.    No Known Allergies  ROS Review of Systems    Objective:    Physical  Exam  There were no vitals taken for this visit. Wt Readings from Last 3 Encounters:  04/26/21 108 lb (49 kg)  01/17/21 104 lb (47.2 kg)  11/29/20 103 lb (46.7 kg)     Health Maintenance Due  Topic Date Due   Pneumococcal Vaccine 51-88 Years old (1 - PCV) Never done   HIV Screening  Never done   Hepatitis C Screening  Never done   PAP-Cervical Cytology Screening  Never done   PAP SMEAR-Modifier  Never done   COVID-19 Vaccine (3 - Booster for Pfizer series) 01/09/2021    There are no preventive care reminders to display for this patient.  Lab Results  Component Value Date   TSH 2.21 10/03/2020   Lab Results  Component Value Date   WBC 8.9 10/03/2020   HGB 14.8 10/03/2020   HCT 44.6 10/03/2020   MCV 95.1 10/03/2020   PLT 304 10/03/2020   Lab Results  Component Value Date   NA 136 10/03/2020   K 4.3 10/03/2020   CO2 25 10/03/2020   GLUCOSE 77 10/03/2020   BUN 12 10/03/2020   CREATININE 0.91 10/03/2020   BILITOT 1.0 10/03/2020   AST 18 10/03/2020   ALT 17 10/03/2020   PROT 7.7 10/03/2020   CALCIUM 9.9 10/03/2020  Lab Results  Component Value Date   CHOL 193 10/03/2020   Lab Results  Component Value Date   HDL 68 10/03/2020   Lab Results  Component Value Date   LDLCALC 109 (H) 10/03/2020   Lab Results  Component Value Date   TRIG 74 10/03/2020   Lab Results  Component Value Date   CHOLHDL 2.8 10/03/2020   No results found for: HGBA1C    Assessment & Plan:   Problem List Items Addressed This Visit       Other   Attention deficit hyperactivity disorder (ADHD) - Primary    No orders of the defined types were placed in this encounter.   Follow-up: No follow-ups on file.    Nani Gasser, MD

## 2021-07-22 ENCOUNTER — Other Ambulatory Visit: Payer: Self-pay | Admitting: Family Medicine

## 2021-07-22 DIAGNOSIS — F411 Generalized anxiety disorder: Secondary | ICD-10-CM

## 2021-08-16 ENCOUNTER — Ambulatory Visit: Payer: Commercial Managed Care - PPO | Admitting: Family Medicine

## 2021-08-17 ENCOUNTER — Ambulatory Visit (INDEPENDENT_AMBULATORY_CARE_PROVIDER_SITE_OTHER): Payer: Commercial Managed Care - PPO | Admitting: Family Medicine

## 2021-08-17 ENCOUNTER — Encounter: Payer: Self-pay | Admitting: Family Medicine

## 2021-08-17 ENCOUNTER — Other Ambulatory Visit: Payer: Self-pay

## 2021-08-17 VITALS — BP 122/70 | HR 77 | Ht 62.0 in | Wt 114.0 lb

## 2021-08-17 DIAGNOSIS — F411 Generalized anxiety disorder: Secondary | ICD-10-CM

## 2021-08-17 DIAGNOSIS — F902 Attention-deficit hyperactivity disorder, combined type: Secondary | ICD-10-CM

## 2021-08-17 DIAGNOSIS — H6981 Other specified disorders of Eustachian tube, right ear: Secondary | ICD-10-CM

## 2021-08-17 DIAGNOSIS — H938X1 Other specified disorders of right ear: Secondary | ICD-10-CM | POA: Diagnosis not present

## 2021-08-17 MED ORDER — AMPHETAMINE-DEXTROAMPHET ER 15 MG PO CP24: 15.0000 mg | ORAL_CAPSULE | Freq: Every morning | ORAL | 0 refills | Status: DC

## 2021-08-17 MED ORDER — AMPHETAMINE-DEXTROAMPHET ER 15 MG PO CP24: 15.0000 mg | ORAL_CAPSULE | ORAL | 0 refills | Status: DC

## 2021-08-17 MED ORDER — FLUTICASONE PROPIONATE 50 MCG/ACT NA SUSP: 2.0000 | Freq: Every day | NASAL | 6 refills | Status: DC

## 2021-08-17 NOTE — Assessment & Plan Note (Signed)
Doing really well on her current regimen.  PHQ-9 score of 3 and GAD-7 score of 3 both improved from prior.  Rates symptoms as not difficult.  Continue with current regimen no changes today.  Follow-up in 6 months.

## 2021-08-17 NOTE — Assessment & Plan Note (Signed)
Well controlled. Continue current regimen. Follow up in  4-6 mo 

## 2021-08-17 NOTE — Progress Notes (Signed)
Established Patient Office Visit  Subjective:  Patient ID: Joy George, female    DOB: 06/24/1996  Age: 25 y.o. MRN: [id number]  CC:  Chief Complaint  Patient presents with   ADHD   Ear Fullness    R ear feels "tight"    HPI Joy George presents for ADHD follow-up.  I last saw her in May at that point we went up to 15 mg on her Adderall XR.  She was supposed to return in 2 months. She is happy with her medication.    She is also concerned because her right ear feels "tight" she says most feels like a pressure.  Is been going on for couple months.  She says it actually is better than it was overall.  She never noticed any drainage she still feels like she is hearing well.   No past medical history on file.  No past surgical history on file.  No family history on file.  Social History   Socioeconomic History   Marital status: Single    Spouse name: Not on file   Number of children: Not on file   Years of education: Not on file   Highest education level: Not on file  Occupational History   Not on file  Tobacco Use   Smoking status: Some Days    Types: E-cigarettes   Smokeless tobacco: Never   Tobacco comments:    1-3 month  Vaping Use   Vaping Use: Every day  Substance and Sexual Activity   Alcohol use: Not on file   Drug use: Not on file   Sexual activity: Not on file  Other Topics Concern   Not on file  Social History Narrative   Not on file   Social Determinants of Health   Financial Resource Strain: Not on file  Food Insecurity: Not on file  Transportation Needs: Not on file  Physical Activity: Not on file  Stress: Not on file  Social Connections: Not on file  Intimate Partner Violence: Not on file    Outpatient Medications Prior to Visit  Medication Sig Dispense Refill   etonogestrel (NEXPLANON) 68 MG IMPL implant by Subdermal route.     sertraline (ZOLOFT) 50 MG tablet TAKE 1 TABLET BY MOUTH EVERY DAY 90 tablet 0    amphetamine-dextroamphetamine (ADDERALL XR) 15 MG 24 hr capsule Take 1 capsule by mouth in the morning. 30 capsule 0   amphetamine-dextroamphetamine (ADDERALL XR) 15 MG 24 hr capsule Take 1 capsule by mouth every morning. 30 capsule 0   No facility-administered medications prior to visit.    No Known Allergies  ROS Review of Systems    Objective:    Physical Exam Constitutional:      Appearance: Normal appearance. She is well-developed.  HENT:     Head: Normocephalic and atraumatic.     Right Ear: Tympanic membrane, ear canal and external ear normal.     Left Ear: Tympanic membrane, ear canal and external ear normal.  Cardiovascular:     Rate and Rhythm: Normal rate and regular rhythm.     Heart sounds: Normal heart sounds.  Pulmonary:     Effort: Pulmonary effort is normal.     Breath sounds: Normal breath sounds.  Musculoskeletal:     Cervical back: No rigidity or tenderness.  Lymphadenopathy:     Cervical: No cervical adenopathy.  Skin:    General: Skin is warm and dry.  Neurological:     Mental Status: She is alert and  oriented to person, place, and time.  Psychiatric:        Behavior: Behavior normal.    BP 122/70   Pulse 77   Ht 5\' 2"  (1.575 m)   Wt 114 lb (51.7 kg)   SpO2 99%   BMI 20.85 kg/m  Wt Readings from Last 3 Encounters:  08/17/21 114 lb (51.7 kg)  04/26/21 108 lb (49 kg)  01/17/21 104 lb (47.2 kg)     There are no preventive care reminders to display for this patient.  There are no preventive care reminders to display for this patient.  Lab Results  Component Value Date   TSH 2.21 10/03/2020   Lab Results  Component Value Date   WBC 8.9 10/03/2020   HGB 14.8 10/03/2020   HCT 44.6 10/03/2020   MCV 95.1 10/03/2020   PLT 304 10/03/2020   Lab Results  Component Value Date   NA 136 10/03/2020   K 4.3 10/03/2020   CO2 25 10/03/2020   GLUCOSE 77 10/03/2020   BUN 12 10/03/2020   CREATININE 0.91 10/03/2020   BILITOT 1.0 10/03/2020    AST 18 10/03/2020   ALT 17 10/03/2020   PROT 7.7 10/03/2020   CALCIUM 9.9 10/03/2020   Lab Results  Component Value Date   CHOL 193 10/03/2020   Lab Results  Component Value Date   HDL 68 10/03/2020   Lab Results  Component Value Date   LDLCALC 109 (H) 10/03/2020   Lab Results  Component Value Date   TRIG 74 10/03/2020   Lab Results  Component Value Date   CHOLHDL 2.8 10/03/2020   No results found for: HGBA1C    Assessment & Plan:   Problem List Items Addressed This Visit       Other   GAD (generalized anxiety disorder)    Doing really well on her current regimen.  PHQ-9 score of 3 and GAD-7 score of 3 both improved from prior.  Rates symptoms as not difficult.  Continue with current regimen no changes today.  Follow-up in 6 months.      Attention deficit hyperactivity disorder (ADHD)    Well controlled. Continue current regimen. Follow up in  4-6 mo      Relevant Medications   amphetamine-dextroamphetamine (ADDERALL XR) 15 MG 24 hr capsule (Start on 10/15/2021)   amphetamine-dextroamphetamine (ADDERALL XR) 15 MG 24 hr capsule (Start on 09/15/2021)   amphetamine-dextroamphetamine (ADDERALL XR) 15 MG 24 hr capsule   Other Visit Diagnoses     Dysfunction of right eustachian tube    -  Primary   Relevant Medications   fluticasone (FLONASE) 50 MCG/ACT nasal spray   Ear pressure, right           Right eustachian tube dysfunction-exam is clear.  Tympanometry is also normal.  Gave reassurance recommend a trial of nasal steroid spray if not improving over the next 2 weeks then please let 09/17/2021 know.  Meds ordered this encounter  Medications   amphetamine-dextroamphetamine (ADDERALL XR) 15 MG 24 hr capsule    Sig: Take 1 capsule by mouth in the morning.    Dispense:  30 capsule    Refill:  0   amphetamine-dextroamphetamine (ADDERALL XR) 15 MG 24 hr capsule    Sig: Take 1 capsule by mouth every morning.    Dispense:  30 capsule    Refill:  0    amphetamine-dextroamphetamine (ADDERALL XR) 15 MG 24 hr capsule    Sig: Take 1 capsule by mouth every morning.  Dispense:  30 capsule    Refill:  0   fluticasone (FLONASE) 50 MCG/ACT nasal spray    Sig: Place 2 sprays into both nostrils daily.    Dispense:  16 g    Refill:  6    Follow-up: Return in about 4 months (around 12/17/2021) for ADD medication .    Nani Gasser, MD

## 2021-12-06 ENCOUNTER — Other Ambulatory Visit: Payer: Self-pay | Admitting: Family Medicine

## 2021-12-06 DIAGNOSIS — F902 Attention-deficit hyperactivity disorder, combined type: Secondary | ICD-10-CM

## 2021-12-07 MED ORDER — AMPHETAMINE-DEXTROAMPHET ER 15 MG PO CP24: 15.0000 mg | ORAL_CAPSULE | Freq: Every morning | ORAL | 0 refills | Status: DC

## 2021-12-11 NOTE — Telephone Encounter (Deleted)
Medication: amphetamine-dextroamphetamine (ADDERALL XR) 15 MG 24 hr capsule Prior authorization submitted via CoverMyMeds on 12/11/2021 [place] submission pending

## 2021-12-11 NOTE — Telephone Encounter (Signed)
This encounter was created in error - please disregard.

## 2021-12-13 ENCOUNTER — Telehealth: Payer: Self-pay

## 2021-12-13 NOTE — Telephone Encounter (Signed)
Medication: amphetamine-dextroamphetamine (ADDERALL XR) 15 MG 24 hr capsule Prior authorization needed due to age restriction Prior authorization submitted via CoverMyMeds on 12/12/2021 [place] submission pending

## 2021-12-13 NOTE — Telephone Encounter (Signed)
Medication: amphetamine-dextroamphetamine (ADDERALL XR) 15 MG 24 hr capsule Prior authorization determination received Medication has been approved Approval dates: 12/12/2021-12/12/2024  Patient aware via: [place] aware: Yes Provider aware via this encounter

## 2021-12-18 ENCOUNTER — Other Ambulatory Visit: Payer: Self-pay

## 2021-12-18 ENCOUNTER — Encounter: Payer: Self-pay | Admitting: Family Medicine

## 2021-12-18 ENCOUNTER — Ambulatory Visit (INDEPENDENT_AMBULATORY_CARE_PROVIDER_SITE_OTHER): Payer: Commercial Managed Care - PPO | Admitting: Family Medicine

## 2021-12-18 VITALS — BP 118/72 | HR 69 | Resp 16 | Ht 62.0 in | Wt 117.0 lb

## 2021-12-18 DIAGNOSIS — F902 Attention-deficit hyperactivity disorder, combined type: Secondary | ICD-10-CM

## 2021-12-18 DIAGNOSIS — F411 Generalized anxiety disorder: Secondary | ICD-10-CM

## 2021-12-18 DIAGNOSIS — F339 Major depressive disorder, recurrent, unspecified: Secondary | ICD-10-CM | POA: Diagnosis not present

## 2021-12-18 NOTE — Assessment & Plan Note (Signed)
Overall she is doing well.  She still has a few symptoms of feeling down and a few symptoms of anxiety but overall she is happy with her current regimen.

## 2021-12-18 NOTE — Assessment & Plan Note (Signed)
He does notice that occasionally if she misses her dose for couple days she will get some electrical shock sensations.  We discussed some strategies around taking an extra pill with her today as a backup so if she starts to notice symptoms are realized that she forgot to take her medications that she can take it as soon as possible.

## 2021-12-18 NOTE — Progress Notes (Signed)
Established Patient Office Visit  Subjective:  Patient ID: Joy George, female    DOB: 12-01-1996  Age: 26 y.o. MRN: [id number]  CC:  Chief Complaint  Patient presents with   ADD    Follow up. Patient has been off of Adderall for 1 month due to cost.      HPI Joy George presents for MDD  ADHD - Reports symptoms are well controlled on current regime. Denies any problems with insomnia, chest pain, palpitations, or SOB.  She has been out of her medication for a while because she has not been able to afford it.  The price jumped up to over $100 recently.  She is not sure why.  She just says her parents went to pick it up so she is not clear.  She has been happy with her medication.   History reviewed. No pertinent past medical history.  History reviewed. No pertinent surgical history.  History reviewed. No pertinent family history.  Social History   Socioeconomic History   Marital status: Single    Spouse name: Not on file   Number of children: Not on file   Years of education: Not on file   Highest education level: Not on file  Occupational History   Not on file  Tobacco Use   Smoking status: Former    Types: E-cigarettes   Smokeless tobacco: Current   Tobacco comments:    Vape   Vaping Use   Vaping Use: Every day  Substance and Sexual Activity   Alcohol use: Not on file   Drug use: Not on file   Sexual activity: Not on file  Other Topics Concern   Not on file  Social History Narrative   Not on file   Social Determinants of Health   Financial Resource Strain: Not on file  Food Insecurity: Not on file  Transportation Needs: Not on file  Physical Activity: Not on file  Stress: Not on file  Social Connections: Not on file  Intimate Partner Violence: Not on file    Outpatient Medications Prior to Visit  Medication Sig Dispense Refill   etonogestrel (NEXPLANON) 68 MG IMPL implant by Subdermal route.     fluticasone (FLONASE) 50 MCG/ACT nasal spray  Place 2 sprays into both nostrils daily. 16 George 6   sertraline (ZOLOFT) 50 MG tablet TAKE 1 TABLET BY MOUTH EVERY DAY 90 tablet 0   amphetamine-dextroamphetamine (ADDERALL XR) 15 MG 24 hr capsule Take 1 capsule by mouth every morning. (Patient not taking: Reported on 12/18/2021) 30 capsule 0   amphetamine-dextroamphetamine (ADDERALL XR) 15 MG 24 hr capsule Take 1 capsule by mouth every morning. (Patient not taking: Reported on 12/18/2021) 30 capsule 0   amphetamine-dextroamphetamine (ADDERALL XR) 15 MG 24 hr capsule Take 1 capsule by mouth in the morning. (Patient not taking: Reported on 12/18/2021) 30 capsule 0   No facility-administered medications prior to visit.    No Known Allergies  ROS Review of Systems    Objective:    Physical Exam Constitutional:      Appearance: Normal appearance. She is well-developed.  HENT:     Head: Normocephalic and atraumatic.  Cardiovascular:     Rate and Rhythm: Normal rate and regular rhythm.     Heart sounds: Normal heart sounds.  Pulmonary:     Effort: Pulmonary effort is normal.     Breath sounds: Normal breath sounds.  Skin:    General: Skin is warm and dry.  Neurological:  Mental Status: She is alert and oriented to person, place, and time.  Psychiatric:        Behavior: Behavior normal.    BP 118/72    Pulse 69    Resp 16    Ht 5\' 2"  (1.575 m)    Wt 117 lb (53.1 kg)    SpO2 100%    BMI 21.40 kg/m  Wt Readings from Last 3 Encounters:  12/18/21 117 lb (53.1 kg)  08/17/21 114 lb (51.7 kg)  04/26/21 108 lb (49 kg)     There are no preventive care reminders to display for this patient.  There are no preventive care reminders to display for this patient.  Lab Results  Component Value Date   TSH 2.21 10/03/2020   Lab Results  Component Value Date   WBC 8.9 10/03/2020   HGB 14.8 10/03/2020   HCT 44.6 10/03/2020   MCV 95.1 10/03/2020   PLT 304 10/03/2020   Lab Results  Component Value Date   NA 136 10/03/2020   K 4.3  10/03/2020   CO2 25 10/03/2020   GLUCOSE 77 10/03/2020   BUN 12 10/03/2020   CREATININE 0.91 10/03/2020   BILITOT 1.0 10/03/2020   AST 18 10/03/2020   ALT 17 10/03/2020   PROT 7.7 10/03/2020   CALCIUM 9.9 10/03/2020   Lab Results  Component Value Date   CHOL 193 10/03/2020   Lab Results  Component Value Date   HDL 68 10/03/2020   Lab Results  Component Value Date   LDLCALC 109 (H) 10/03/2020   Lab Results  Component Value Date   TRIG 74 10/03/2020   Lab Results  Component Value Date   CHOLHDL 2.8 10/03/2020   No results found for: HGBA1C    Assessment & Plan:   Problem List Items Addressed This Visit       Other   GAD (generalized anxiety disorder)    He does notice that occasionally if she misses her dose for couple days she will get some electrical shock sensations.  We discussed some strategies around taking an extra pill with her today as a backup so if she starts to notice symptoms are realized that she forgot to take her medications that she can take it as soon as possible.      Depression, recurrent (HCC)    Overall she is doing well.  She still has a few symptoms of feeling down and a few symptoms of anxiety but overall she is happy with her current regimen.      Attention deficit hyperactivity disorder (ADHD) - Primary    Discussed options.  It may be that she has a deductible, it may be that medication is no longer covered and found that a little unusual because it is generic.  She did switch her and's plan so that certainly may be contributing they may have even possibly ran her old card and not her new.  Encouraged her to talk with the pharmacy and even call her insurance to see if she may have a deductible.  The short acting twice a day may be more cost effective if we need to switch.  Or if the insurance will cover something like Vyvanse which is very similar to extended release Adderall we could certainly change that as well.  She will try to check into  it over the next couple of days and give 13/12/2019 a call or send [place] a MyChart and let us know.  No orders of the defined types were placed in this encounter.   Follow-up: Return in about 6 months (around 06/17/2022) for ADHD, Or sooner in 2 months if we have to change you medication .    Joy Gasseratherine Ellese Julius, MD

## 2021-12-18 NOTE — Assessment & Plan Note (Signed)
Discussed options.  It may be that she has a deductible, it may be that medication is no longer covered and found that a little unusual because it is generic.  She did switch her and's plan so that certainly may be contributing they may have even possibly ran her old card and not her new.  Encouraged her to talk with the pharmacy and even call her insurance to see if she may have a deductible.  The short acting twice a day may be more cost effective if we need to switch.  Or if the insurance will cover something like Vyvanse which is very similar to extended release Adderall we could certainly change that as well.  She will try to check into it over the next couple of days and give [place] a call or send [place] a MyChart and let us know.

## 2022-01-20 ENCOUNTER — Other Ambulatory Visit: Payer: Self-pay | Admitting: Family Medicine

## 2022-01-20 DIAGNOSIS — F902 Attention-deficit hyperactivity disorder, combined type: Secondary | ICD-10-CM

## 2022-01-22 MED ORDER — AMPHETAMINE-DEXTROAMPHET ER 15 MG PO CP24: 15.0000 mg | ORAL_CAPSULE | Freq: Every morning | ORAL | 0 refills | Status: DC

## 2022-01-24 ENCOUNTER — Other Ambulatory Visit: Payer: Self-pay | Admitting: Family Medicine

## 2022-01-24 DIAGNOSIS — F411 Generalized anxiety disorder: Secondary | ICD-10-CM

## 2022-02-15 ENCOUNTER — Other Ambulatory Visit: Payer: Self-pay | Admitting: Family Medicine

## 2022-03-02 ENCOUNTER — Other Ambulatory Visit: Payer: Self-pay | Admitting: Family Medicine

## 2022-03-02 DIAGNOSIS — F902 Attention-deficit hyperactivity disorder, combined type: Secondary | ICD-10-CM

## 2022-03-02 MED ORDER — AMPHETAMINE-DEXTROAMPHET ER 15 MG PO CP24: 15.0000 mg | ORAL_CAPSULE | Freq: Every morning | ORAL | 0 refills | Status: DC

## 2022-03-15 ENCOUNTER — Other Ambulatory Visit: Payer: Self-pay | Admitting: Family Medicine

## 2022-03-15 DIAGNOSIS — F902 Attention-deficit hyperactivity disorder, combined type: Secondary | ICD-10-CM

## 2022-03-19 ENCOUNTER — Other Ambulatory Visit: Payer: Self-pay | Admitting: Family Medicine

## 2022-03-19 DIAGNOSIS — F902 Attention-deficit hyperactivity disorder, combined type: Secondary | ICD-10-CM

## 2022-03-19 MED ORDER — AMPHETAMINE-DEXTROAMPHET ER 15 MG PO CP24: 15.0000 mg | ORAL_CAPSULE | Freq: Every morning | ORAL | 0 refills | Status: DC

## 2022-04-26 IMAGING — DX DG HIP (WITH OR WITHOUT PELVIS) 5+V BILAT
5 series · 5 of 5 positions shown · non-contrast
Comparison: None.

CLINICAL DATA: Pain

EXAM:
DG HIP (WITH OR WITHOUT PELVIS) 4+V BILAT

[pelvis ap]
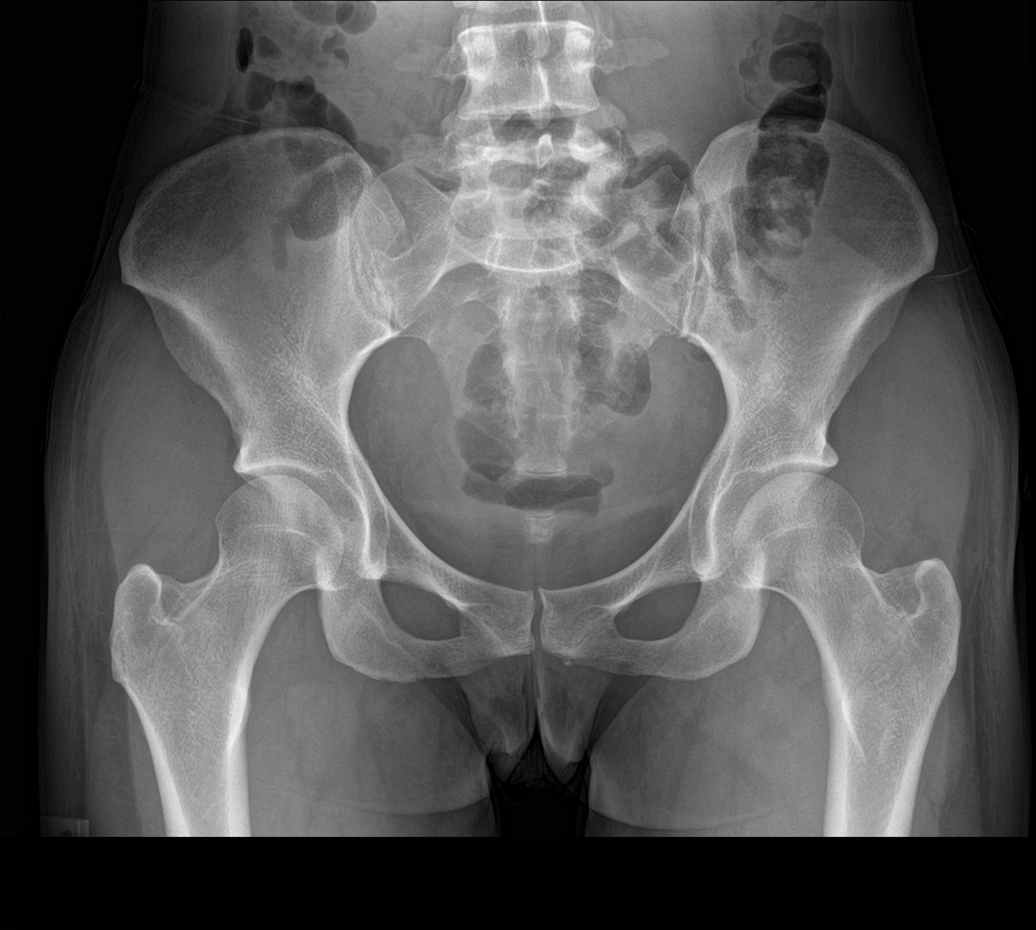

[hip ap (1 of 2)]
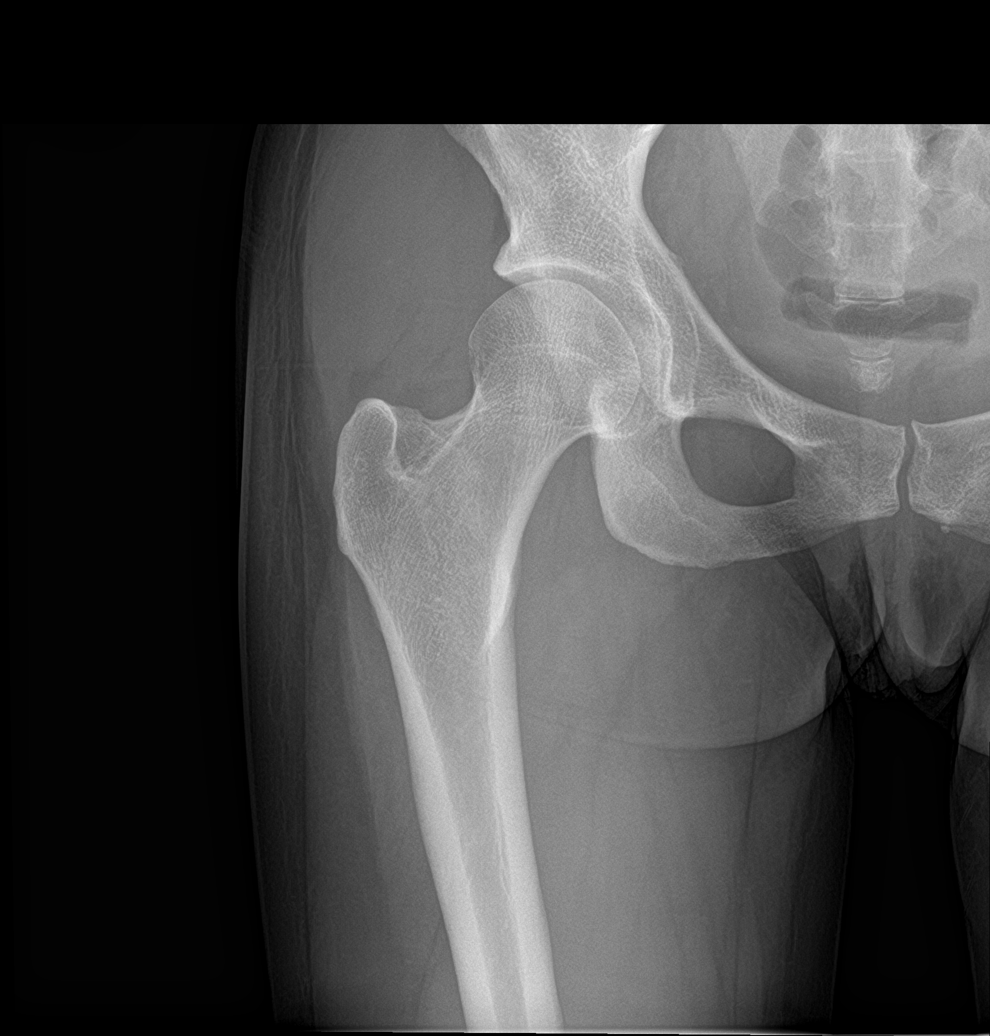

[hip lat (1 of 2)]
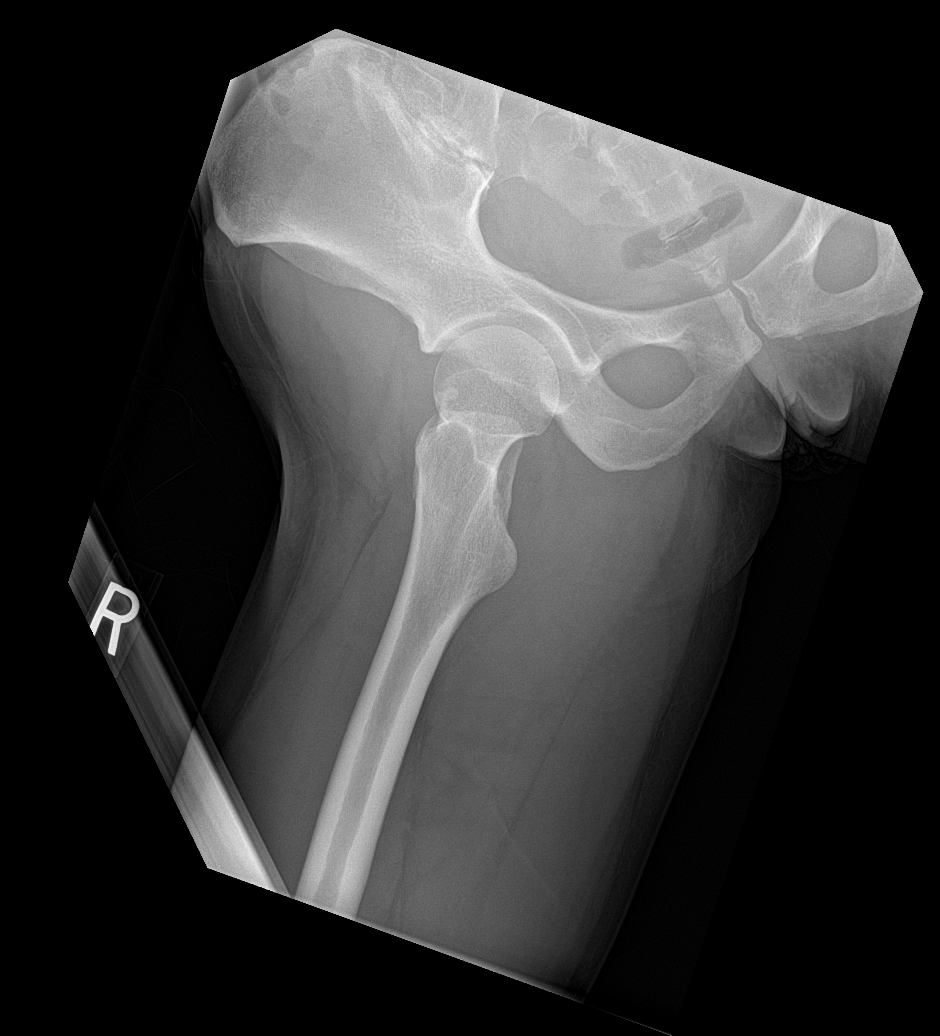

[hip ap (2 of 2)]
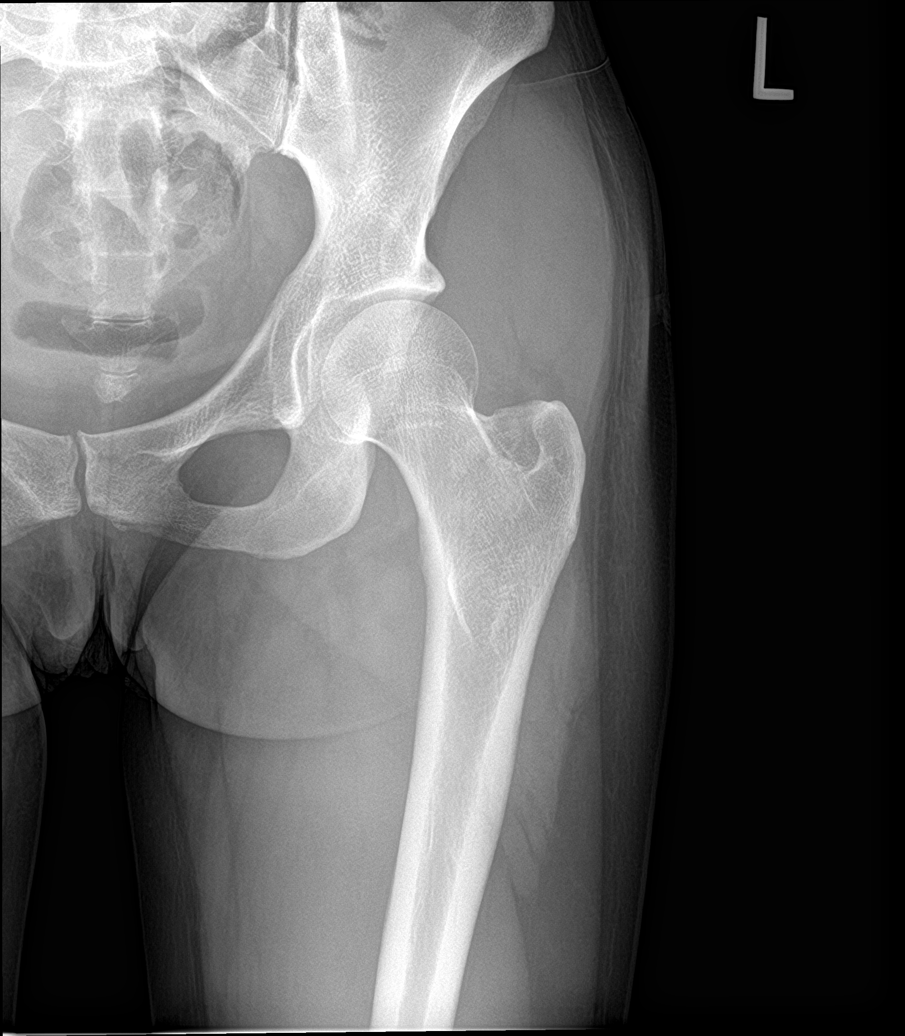

[hip lat (2 of 2)]
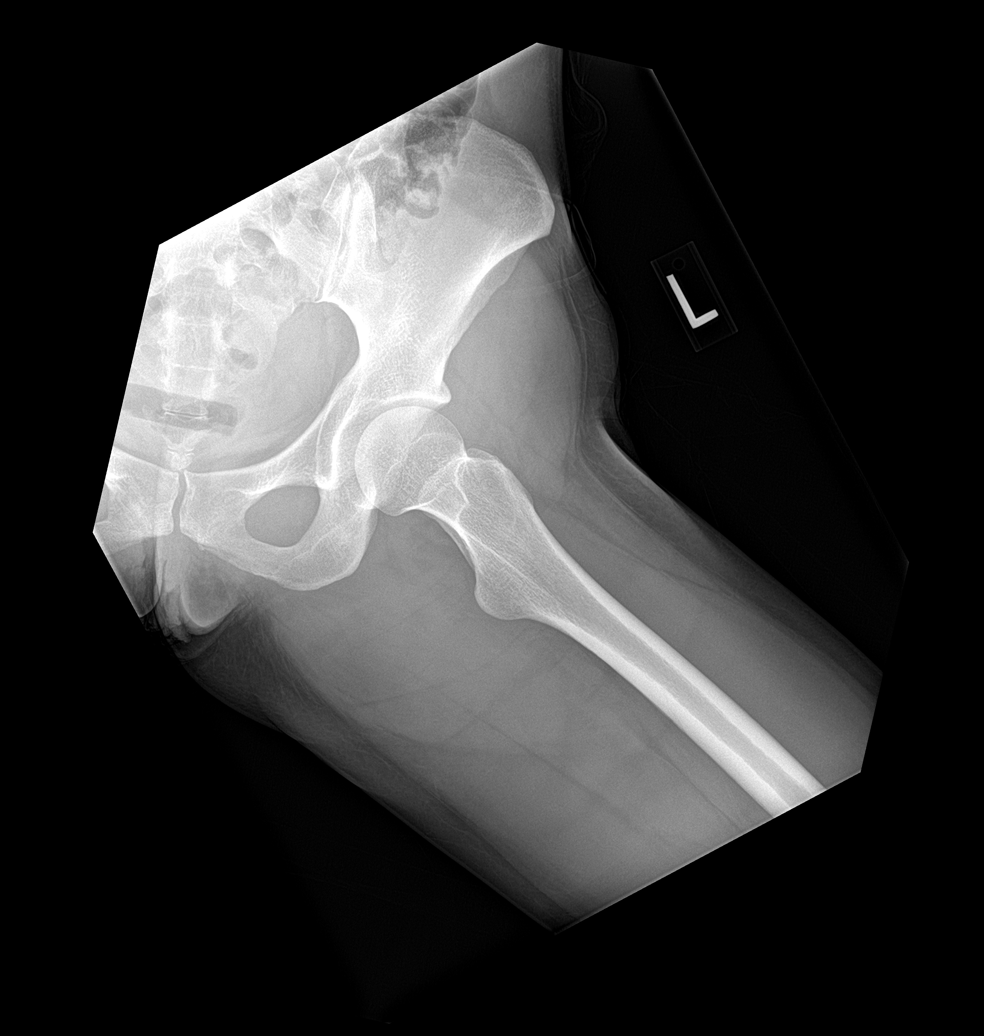

[5 of 5 positions shown; findings below may reference images not displayed]

FINDINGS: Frontal pelvis as well as frontal and lateral views of each hip
joint-total 5-views obtained. No appreciable fracture or
dislocation. Joint spaces appear unremarkable. No erosion. No
avascular necrosis. Femoral heads appear symmetric bilaterally.
There is slight bony overgrowth along each superolateral acetabulum,
symmetric. Sacroiliac joints appear normal bilaterally.
IMPRESSION: Slight symmetric bony overgrowth along each superolateral
acetabulum, a finding that potentially may place patient at
increased risk for a degree of femoroacetabular impingement. No
appreciable joint space narrowing or erosion. Symmetric femoral
heads bilaterally without avascular necrosis or hypoplasia. No
fracture or dislocation.

## 2022-05-10 ENCOUNTER — Telehealth (INDEPENDENT_AMBULATORY_CARE_PROVIDER_SITE_OTHER): Payer: Commercial Managed Care - PPO | Admitting: Family Medicine

## 2022-05-10 ENCOUNTER — Encounter: Payer: Self-pay | Admitting: Family Medicine

## 2022-05-10 DIAGNOSIS — F902 Attention-deficit hyperactivity disorder, combined type: Secondary | ICD-10-CM

## 2022-05-10 DIAGNOSIS — F411 Generalized anxiety disorder: Secondary | ICD-10-CM | POA: Diagnosis not present

## 2022-05-10 MED ORDER — AMPHETAMINE-DEXTROAMPHET ER 15 MG PO CP24: 15.0000 mg | ORAL_CAPSULE | ORAL | 0 refills | Status: DC

## 2022-05-10 MED ORDER — AMPHETAMINE-DEXTROAMPHET ER 15 MG PO CP24: 15.0000 mg | ORAL_CAPSULE | Freq: Every morning | ORAL | 0 refills | Status: DC

## 2022-05-10 NOTE — Assessment & Plan Note (Signed)
Stable on current regimen.  Follow-up in 6 months. 

## 2022-05-10 NOTE — Progress Notes (Signed)
Virtual Visit via Video Note  I connected with Joy George on 05/10/22 at 11:30 AM EDT by a video enabled telemedicine application and verified that I am speaking with the correct person using two identifiers.   I discussed the limitations of evaluation and management by telemedicine and the availability of in person appointments. The patient expressed understanding and agreed to proceed.  Patient location: at home Provider location: in office  Subjective:    CC:   Chief Complaint  Patient presents with   ADHD    Follow up. Patient has been off of Adderall 15 mg for 2 months.    Anxiety    Follow up. Patient states Zoloft is working well for her.     HPI: ADHD - Reports symptoms are well controlled on current regime. Denies any problems with insomnia, chest pain, palpitations, or SOB.  She has been out of medication for the last couple of months and it has made it a little bit more stressful at work.  Last time I saw her she had a pretty significant co-pay for medications but she was able to find a good Rx coupon and so it is much more reasonable now.  Follow-up generalized anxiety disorder-she is doing well with the sertraline.  She does feel like it curbs her anxiety and it is helpful.  She does not want to make any dose changes today.    Past medical history, Surgical history, Family history not pertinant except as noted below, Social history, Allergies, and medications have been entered into the medical record, reviewed, and corrections made.    Objective:    General: Speaking clearly in complete sentences without any shortness of breath.  Alert and oriented x3.  Normal judgment. No apparent acute distress.    Impression and Recommendations:    Problem List Items Addressed This Visit       Other   GAD (generalized anxiety disorder)    Stable on current regimen.  Follow-up in 6 months.      Attention deficit hyperactivity disorder (ADHD)    Well controlled.  Continue current regimen. Follow up in  6 mo         Relevant Medications   amphetamine-dextroamphetamine (ADDERALL XR) 15 MG 24 hr capsule (Start on 07/08/2022)   amphetamine-dextroamphetamine (ADDERALL XR) 15 MG 24 hr capsule (Start on 06/08/2022)   amphetamine-dextroamphetamine (ADDERALL XR) 15 MG 24 hr capsule    No orders of the defined types were placed in this encounter.   Meds ordered this encounter  Medications   amphetamine-dextroamphetamine (ADDERALL XR) 15 MG 24 hr capsule    Sig: Take 1 capsule by mouth every morning.    Dispense:  30 capsule    Refill:  0   amphetamine-dextroamphetamine (ADDERALL XR) 15 MG 24 hr capsule    Sig: Take 1 capsule by mouth every morning.    Dispense:  30 capsule    Refill:  0   amphetamine-dextroamphetamine (ADDERALL XR) 15 MG 24 hr capsule    Sig: Take 1 capsule by mouth in the morning.    Dispense:  30 capsule    Refill:  0     I discussed the assessment and treatment plan with the patient. The patient was provided an opportunity to ask questions and all were answered. The patient agreed with the plan and demonstrated an understanding of the instructions.   The patient was advised to call back or seek an in-person evaluation if the symptoms worsen or if the  condition fails to improve as anticipated.   Beatrice Lecher, MD

## 2022-05-10 NOTE — Assessment & Plan Note (Signed)
Well controlled. Continue current regimen. Follow up in  6 mo  

## 2022-06-10 ENCOUNTER — Other Ambulatory Visit: Payer: Self-pay | Admitting: Family Medicine

## 2022-06-10 DIAGNOSIS — F902 Attention-deficit hyperactivity disorder, combined type: Secondary | ICD-10-CM

## 2022-06-11 ENCOUNTER — Other Ambulatory Visit: Payer: Self-pay | Admitting: Family Medicine

## 2022-06-11 DIAGNOSIS — F902 Attention-deficit hyperactivity disorder, combined type: Secondary | ICD-10-CM

## 2022-06-11 NOTE — Telephone Encounter (Signed)
Duplicate request. No auth/access to denied controlled rx.

## 2022-06-11 NOTE — Telephone Encounter (Signed)
Is there a problem at the pharmacy?  Prescriptions were sent for June July and August.  So I suspect she just needs to call her pharmacy and get the prescription for July filled.

## 2022-06-12 NOTE — Telephone Encounter (Signed)
Rx denied. She need to call her pharmacy as they should have July on file fo her to fill

## 2022-06-13 NOTE — Telephone Encounter (Signed)
LVM advising pt that this prescription was already at her pharmacy and that she should call them to get this released. Told to call our office if she has any problems.

## 2022-07-20 ENCOUNTER — Other Ambulatory Visit: Payer: Self-pay | Admitting: Family Medicine

## 2022-07-20 DIAGNOSIS — F411 Generalized anxiety disorder: Secondary | ICD-10-CM

## 2022-09-18 ENCOUNTER — Other Ambulatory Visit: Payer: Self-pay | Admitting: Family Medicine

## 2022-09-18 DIAGNOSIS — F902 Attention-deficit hyperactivity disorder, combined type: Secondary | ICD-10-CM

## 2022-09-18 MED ORDER — AMPHETAMINE-DEXTROAMPHET ER 15 MG PO CP24: 15.0000 mg | ORAL_CAPSULE | Freq: Every morning | ORAL | 0 refills | Status: DC

## 2022-09-18 MED ORDER — AMPHETAMINE-DEXTROAMPHET ER 15 MG PO CP24: 15.0000 mg | ORAL_CAPSULE | ORAL | 0 refills | Status: DC

## 2022-09-18 NOTE — Telephone Encounter (Signed)
Meds ordered this encounter  Medications   amphetamine-dextroamphetamine (ADDERALL XR) 15 MG 24 hr capsule    Sig: Take 1 capsule by mouth every morning.    Dispense:  30 capsule    Refill:  0   amphetamine-dextroamphetamine (ADDERALL XR) 15 MG 24 hr capsule    Sig: Take 1 capsule by mouth every morning.    Dispense:  30 capsule    Refill:  0   amphetamine-dextroamphetamine (ADDERALL XR) 15 MG 24 hr capsule    Sig: Take 1 capsule by mouth in the morning.    Dispense:  30 capsule    Refill:  0

## 2022-10-29 ENCOUNTER — Other Ambulatory Visit: Payer: Self-pay | Admitting: Family Medicine

## 2022-10-29 DIAGNOSIS — F902 Attention-deficit hyperactivity disorder, combined type: Secondary | ICD-10-CM

## 2022-10-29 MED ORDER — AMPHETAMINE-DEXTROAMPHET ER 15 MG PO CP24: 15.0000 mg | ORAL_CAPSULE | ORAL | 0 refills | Status: DC

## 2022-10-29 NOTE — Telephone Encounter (Signed)
Duplicate request - no authorization to refuse auto refill request.

## 2022-12-20 ENCOUNTER — Other Ambulatory Visit: Payer: Self-pay | Admitting: Family Medicine

## 2022-12-20 DIAGNOSIS — F902 Attention-deficit hyperactivity disorder, combined type: Secondary | ICD-10-CM

## 2022-12-28 ENCOUNTER — Other Ambulatory Visit: Payer: Self-pay | Admitting: Family Medicine

## 2022-12-28 DIAGNOSIS — F411 Generalized anxiety disorder: Secondary | ICD-10-CM

## 2023-01-19 ENCOUNTER — Other Ambulatory Visit: Payer: Self-pay | Admitting: Family Medicine

## 2023-01-19 DIAGNOSIS — F411 Generalized anxiety disorder: Secondary | ICD-10-CM

## 2023-01-23 ENCOUNTER — Other Ambulatory Visit: Payer: Self-pay | Admitting: Family Medicine

## 2023-01-23 DIAGNOSIS — F411 Generalized anxiety disorder: Secondary | ICD-10-CM

## 2023-01-23 NOTE — Telephone Encounter (Signed)
Please call pt she is due for f/u for medication refills

## 2023-01-23 NOTE — Telephone Encounter (Signed)
Patient scheduled for 02/06/2023 @ 10:10 tvt

## 2023-02-06 ENCOUNTER — Ambulatory Visit: Payer: Commercial Managed Care - PPO | Admitting: Family Medicine

## 2023-02-21 ENCOUNTER — Ambulatory Visit (INDEPENDENT_AMBULATORY_CARE_PROVIDER_SITE_OTHER): Payer: Commercial Managed Care - PPO | Admitting: Family Medicine

## 2023-02-21 ENCOUNTER — Encounter: Payer: Self-pay | Admitting: Family Medicine

## 2023-02-21 VITALS — BP 116/64 | HR 62 | Ht 62.0 in | Wt 130.0 lb

## 2023-02-21 DIAGNOSIS — F411 Generalized anxiety disorder: Secondary | ICD-10-CM

## 2023-02-21 DIAGNOSIS — F902 Attention-deficit hyperactivity disorder, combined type: Secondary | ICD-10-CM

## 2023-02-21 DIAGNOSIS — F339 Major depressive disorder, recurrent, unspecified: Secondary | ICD-10-CM

## 2023-02-21 MED ORDER — SERTRALINE HCL 50 MG PO TABS: 50.0000 mg | ORAL_TABLET | Freq: Every day | ORAL | 1 refills | Status: DC

## 2023-02-21 MED ORDER — AMPHETAMINE-DEXTROAMPHET ER 15 MG PO CP24: 15.0000 mg | ORAL_CAPSULE | ORAL | 0 refills | Status: DC

## 2023-02-21 MED ORDER — AMPHETAMINE-DEXTROAMPHET ER 15 MG PO CP24: 15.0000 mg | ORAL_CAPSULE | Freq: Every morning | ORAL | 0 refills | Status: DC

## 2023-02-21 NOTE — Assessment & Plan Note (Signed)
Stable Continue sertraline 

## 2023-02-21 NOTE — Assessment & Plan Note (Signed)
Continue current dose of sertraline 50 mg daily.  We can always make an adjustment if needed.

## 2023-02-21 NOTE — Progress Notes (Signed)
   Established Patient Office Visit  Subjective   Patient ID: Joy George Orders, female    DOB: 1996/03/05  Age: 27 y.o. MRN: [id number]  Chief Complaint  Patient presents with   ADHD   mood    HPI   ADD - Reports symptoms are well controlled on current regime. Denies any problems with insomnia, chest pain, palpitations, or SOB.    F/U GAD -she feels like the sertraline 50 mg is working well.  She still has some anxious thoughts overall but feels like she is happy with her regimen and really does not want to make any changes or adjustments today failure.  She feels like the combination of also treating the ADD has helped improve her anxiety as well.     ROS    Objective:     BP 116/64   Pulse 62   Ht 5\' 2"  (1.575 m)   Wt 130 lb (59 kg)   SpO2 99%   BMI 23.78 kg/m    Physical Exam Vitals and nursing note reviewed.  Constitutional:      Appearance: She is well-developed.  HENT:     Head: Normocephalic and atraumatic.  Cardiovascular:     Rate and Rhythm: Normal rate and regular rhythm.     Heart sounds: Normal heart sounds.  Pulmonary:     Effort: Pulmonary effort is normal.     Breath sounds: Normal breath sounds.  Skin:    General: Skin is warm and dry.  Neurological:     Mental Status: She is alert and oriented to person, place, and time.  Psychiatric:        Behavior: Behavior normal.      No results found for any visits on 02/21/23.    The ASCVD Risk score (Arnett DK, et al., 2019) failed to calculate for the following reasons:   The 2019 ASCVD risk score is only valid for ages 34 to 35    Assessment & Plan:   Problem List Items Addressed This Visit       Other   GAD (generalized anxiety disorder)    Continue current dose of sertraline 50 mg daily.  We can always make an adjustment if needed.      Relevant Medications   sertraline (ZOLOFT) 50 MG tablet   Depression, recurrent (HCC) - Primary    Stable.  Continue sertraline.        Relevant Medications   sertraline (ZOLOFT) 50 MG tablet   Attention deficit hyperactivity disorder (ADHD)    Well controlled. Continue current regimen. Follow up in  6 mo       Relevant Medications   amphetamine-dextroamphetamine (ADDERALL XR) 15 MG 24 hr capsule   amphetamine-dextroamphetamine (ADDERALL XR) 15 MG 24 hr capsule (Start on 03/23/2023)   amphetamine-dextroamphetamine (ADDERALL XR) 15 MG 24 hr capsule (Start on 04/21/2023)    Return in about 6 months (around 08/24/2023) for ADD and Anxiety .    Beatrice Lecher, MD

## 2023-02-21 NOTE — Assessment & Plan Note (Signed)
Well controlled. Continue current regimen. Follow up in  6 mo  

## 2023-02-21 NOTE — Progress Notes (Signed)
Patient doing well on current regimen.  Previous PHQ= 8/SWD GAD=13/SWD

## 2023-03-26 ENCOUNTER — Other Ambulatory Visit: Payer: Self-pay | Admitting: Family Medicine

## 2023-03-26 DIAGNOSIS — F902 Attention-deficit hyperactivity disorder, combined type: Secondary | ICD-10-CM

## 2023-03-26 MED ORDER — AMPHETAMINE-DEXTROAMPHET ER 15 MG PO CP24: 15.0000 mg | ORAL_CAPSULE | ORAL | 0 refills | Status: DC

## 2023-03-26 NOTE — Telephone Encounter (Signed)
Duplicate refill request.

## 2023-04-30 ENCOUNTER — Other Ambulatory Visit: Payer: Self-pay | Admitting: Family Medicine

## 2023-04-30 DIAGNOSIS — F902 Attention-deficit hyperactivity disorder, combined type: Secondary | ICD-10-CM

## 2023-05-01 ENCOUNTER — Other Ambulatory Visit: Payer: Self-pay | Admitting: Family Medicine

## 2023-05-01 DIAGNOSIS — F902 Attention-deficit hyperactivity disorder, combined type: Secondary | ICD-10-CM

## 2023-07-15 ENCOUNTER — Encounter: Payer: Self-pay | Admitting: Family Medicine

## 2023-07-15 ENCOUNTER — Ambulatory Visit (INDEPENDENT_AMBULATORY_CARE_PROVIDER_SITE_OTHER): Payer: Commercial Managed Care - PPO | Admitting: Family Medicine

## 2023-07-15 VITALS — BP 125/85 | HR 84 | Ht 62.0 in | Wt 129.0 lb

## 2023-07-15 DIAGNOSIS — F411 Generalized anxiety disorder: Secondary | ICD-10-CM | POA: Diagnosis not present

## 2023-07-15 DIAGNOSIS — R519 Headache, unspecified: Secondary | ICD-10-CM | POA: Diagnosis not present

## 2023-07-15 DIAGNOSIS — F902 Attention-deficit hyperactivity disorder, combined type: Secondary | ICD-10-CM | POA: Diagnosis not present

## 2023-07-15 DIAGNOSIS — Z23 Encounter for immunization: Secondary | ICD-10-CM

## 2023-07-15 DIAGNOSIS — F339 Major depressive disorder, recurrent, unspecified: Secondary | ICD-10-CM

## 2023-07-15 MED ORDER — AMPHETAMINE-DEXTROAMPHET ER 15 MG PO CP24: 15.0000 mg | ORAL_CAPSULE | ORAL | 0 refills | Status: DC

## 2023-07-15 MED ORDER — AMPHETAMINE-DEXTROAMPHET ER 15 MG PO CP24
15.0000 mg | ORAL_CAPSULE | Freq: Every morning | ORAL | 0 refills | Status: AC
Start: 2023-09-12 — End: ?

## 2023-07-15 NOTE — Progress Notes (Signed)
Established Patient Office Visit  Subjective   Patient ID: Joy George, female    DOB: [birth year]  Age: 27 y.o. MRN: [id number]  Chief Complaint  Patient presents with   mood   ADD   Migraine    Pt reports that     HPI ADD - Reports symptoms are well controlled on current regime. Denies any problems with insomnia, chest pain, palpitations, or SOB.  Has been off her meds since May.   F/U depression/anxiety - Mood is fair.  She is on sertraline.  On medication daily.    She says she has been having more sinus pressure and headaches. Will get pain and pressure in her ears and then sharp pain in the back of her head and then will have a tension type headache that can last hours. Helps if can lay dow and sleep. The HA is improve if not resolve.       ROS    Objective:     BP 125/85   Pulse 84   Ht 5\' 2"  (1.575 m)   Wt 129 lb (58.5 kg)   SpO2 97%   BMI 23.59 kg/m    Physical Exam Vitals and nursing note reviewed.  Constitutional:      Appearance: She is well-developed.  HENT:     Head: Normocephalic and atraumatic.     Right Ear: Tympanic membrane, ear canal and external ear normal.     Left Ear: Ear canal and external ear normal.     Ears:     Comments: Left TM  - light reflex is absent.      Mouth/Throat:     Pharynx: Oropharynx is clear.  Eyes:     Conjunctiva/sclera: Conjunctivae normal.  Cardiovascular:     Rate and Rhythm: Normal rate and regular rhythm.     Heart sounds: Normal heart sounds.  Pulmonary:     Effort: Pulmonary effort is normal.     Breath sounds: Normal breath sounds.  Skin:    General: Skin is warm and dry.  Neurological:     Mental Status: She is alert and oriented to person, place, and time.  Psychiatric:        Behavior: Behavior normal.     No results found for any visits on 07/15/23.    The ASCVD Risk score (Arnett DK, et al., 2019) failed to calculate for the following reasons:   The 2019 ASCVD risk score is only  valid for ages 33 to 60    Assessment & Plan:   Problem List Items Addressed This Visit       Other   GAD (generalized anxiety disorder) - Primary    Doing well on current regimen.        Depression, recurrent (HCC)   Attention deficit hyperactivity disorder (ADHD)    Well controlled. Continue current regimen. Follow up in  6 months. Refilled for 9 0days. Will refill again before next f/u appt      Relevant Medications   amphetamine-dextroamphetamine (ADDERALL XR) 15 MG 24 hr capsule   amphetamine-dextroamphetamine (ADDERALL XR) 15 MG 24 hr capsule (Start on 08/14/2023)   amphetamine-dextroamphetamine (ADDERALL XR) 15 MG 24 hr capsule (Start on 09/12/2023)   Other Visit Diagnoses     Acute nonintractable headache, unspecified headache type          Headaches-sounds like there is also been a lot of concomitant sinus pressure.  Discussed a trial of Flonase or nasal steroid spray starting with 2 sprays  in each nostril daily for 2 weeks and then if tolerating well okay to decrease down to 1 spray in each nostril.  I want to see if this helps with some of the sinus and ear pressure and fullness that she has been getting and if that in turn helps reduce the frequency of the headaches.  If the headaches are still continuing then we can certainly discuss prophylactic father has a history of cluster headaches.  Does not quite meet the criteria for cluster headaches herself typically cluster headaches are seen daily for weeks to months at a time and typically focused around the right eye and possible combination with drooping of the lid and tearing.  Her headaches sound more consistent with migraine type headaches.  And we could certainly consider prophylaxis if treating her sinuses does not improve the frequency of her headaches    Return in about 6 months (around 01/15/2024) for ADD .    Nani Gasser, MD

## 2023-07-15 NOTE — Assessment & Plan Note (Signed)
Well controlled. Continue current regimen. Follow up in  6 months. Refilled for 9 0days. Will refill again before next f/u appt

## 2023-07-15 NOTE — Patient Instructions (Signed)
Try flonase - 2 sprays in each nostril daily for 2 weeks and if tolerating well can decrease to 1 spray in each nostril daily.

## 2023-07-15 NOTE — Assessment & Plan Note (Signed)
Doing well on current regimen .  

## 2023-08-06 ENCOUNTER — Other Ambulatory Visit: Payer: Self-pay | Admitting: Family Medicine

## 2023-08-06 DIAGNOSIS — F902 Attention-deficit hyperactivity disorder, combined type: Secondary | ICD-10-CM

## 2023-08-26 ENCOUNTER — Ambulatory Visit: Payer: Commercial Managed Care - PPO | Admitting: Family Medicine

## 2023-12-02 ENCOUNTER — Other Ambulatory Visit: Payer: Self-pay | Admitting: Family Medicine

## 2023-12-02 DIAGNOSIS — F411 Generalized anxiety disorder: Secondary | ICD-10-CM

## 2024-01-15 ENCOUNTER — Ambulatory Visit (INDEPENDENT_AMBULATORY_CARE_PROVIDER_SITE_OTHER): Payer: Commercial Managed Care - PPO | Admitting: Family Medicine

## 2024-01-15 ENCOUNTER — Encounter: Payer: Self-pay | Admitting: Family Medicine

## 2024-01-15 VITALS — BP 117/61 | HR 54 | Ht 62.0 in | Wt 117.0 lb

## 2024-01-15 DIAGNOSIS — F411 Generalized anxiety disorder: Secondary | ICD-10-CM | POA: Diagnosis not present

## 2024-01-15 DIAGNOSIS — F902 Attention-deficit hyperactivity disorder, combined type: Secondary | ICD-10-CM | POA: Diagnosis not present

## 2024-01-15 DIAGNOSIS — F339 Major depressive disorder, recurrent, unspecified: Secondary | ICD-10-CM

## 2024-01-15 DIAGNOSIS — M7918 Myalgia, other site: Secondary | ICD-10-CM

## 2024-01-15 MED ORDER — SERTRALINE HCL 100 MG PO TABS: 100.0000 mg | ORAL_TABLET | Freq: Every day | ORAL | 0 refills | Status: DC

## 2024-01-15 MED ORDER — AMPHETAMINE-DEXTROAMPHET ER 20 MG PO CP24: 20.0000 mg | ORAL_CAPSULE | ORAL | 0 refills | Status: AC

## 2024-01-15 MED ORDER — AMPHETAMINE-DEXTROAMPHET ER 20 MG PO CP24
20.0000 mg | ORAL_CAPSULE | ORAL | 0 refills | Status: AC
Start: 2024-01-15 — End: ?

## 2024-01-15 NOTE — Assessment & Plan Note (Signed)
She has been struggling more with her mood lately.  Work has been incredibly stressful they have been short staffed and it has been a little chaotic.  She would like to stay at her job but would like to bump up the sertraline if at all possible at least for now.  Will increase to 100 mg.  Call if any problems or concerns otherwise I will see her back in 8 weeks.

## 2024-01-15 NOTE — Assessment & Plan Note (Addendum)
She feels like open alert place even when she takes her medicine she feels like it might need to be adjusted upward she is also been struggling with her mood little bit more which could be aggravating things and she has not been sleeping as well.Focused will inc dose to 20mg  on Adderall. F/U in 8 weeks.

## 2024-01-15 NOTE — Progress Notes (Signed)
Established Patient Office Visit  Subjective  Patient ID: Joy George, female    DOB: 15-Oct-1996  Age: 28 y.o. MRN: [id number]  Chief Complaint  Patient presents with   ADD    HPI  She is here for follow-up for ADD and mood.  She also notes since the fall she has been having pain in her right buttock area she thinks it is the sacroiliac joint.  She has been doing some exercises on her own that she found online and that has improved her pain but it has not gone completely away.  It never radiates down into her leg it is in that upper medial buttock area.    ROS    Objective:     BP 117/61   Pulse (!) 54   Ht 5\' 2"  (1.575 m)   Wt 117 lb (53.1 kg)   SpO2 95%   BMI 21.40 kg/m    Physical Exam Vitals and nursing note reviewed.  Constitutional:      Appearance: Normal appearance.  HENT:     Head: Normocephalic and atraumatic.  Eyes:     Conjunctiva/sclera: Conjunctivae normal.  Cardiovascular:     Rate and Rhythm: Normal rate and regular rhythm.  Pulmonary:     Effort: Pulmonary effort is normal.     Breath sounds: Normal breath sounds.  Musculoskeletal:     Comments: Nontender over the lumbar spine or SI joints.  Tender over the piriformis muscle she did have some pain with hip abduction stretching that area.  Normal gait.  Skin:    General: Skin is warm and dry.  Neurological:     Mental Status: She is alert.  Psychiatric:        Mood and Affect: Mood normal.      No results found for any visits on 01/15/24.    The ASCVD Risk score (Arnett DK, et al., 2019) failed to calculate for the following reasons:   The 2019 ASCVD risk score is only valid for ages 81 to 68    Assessment & Plan:   Problem List Items Addressed This Visit       Other   GAD (generalized anxiety disorder)   Relevant Medications   sertraline (ZOLOFT) 100 MG tablet   Depression, recurrent (HCC)   She has been struggling more with her mood lately.  Work has been incredibly  stressful they have been short staffed and it has been a little chaotic.  She would like to stay at her job but would like to bump up the sertraline if at all possible at least for now.  Will increase to 100 mg.  Call if any problems or concerns otherwise I will see her back in 8 weeks.      Relevant Medications   sertraline (ZOLOFT) 100 MG tablet   Attention deficit hyperactivity disorder (ADHD) - Primary   She feels like open alert place even when she takes her medicine she feels like it might need to be adjusted upward she is also been struggling with her mood little bit more which could be aggravating things and she has not been sleeping as well.Focused will inc dose to 20mg  on Adderall. F/U in 8 weeks.        Relevant Medications   amphetamine-dextroamphetamine (ADDERALL XR) 20 MG 24 hr capsule   amphetamine-dextroamphetamine (ADDERALL XR) 20 MG 24 hr capsule (Start on 02/12/2024)   Other Visit Diagnoses       Right buttock pain  Right buttock pain most consistent with piriformis syndrome-handout stretches given to do on her own at home if not proving over the next 3 weeks then we will get her in with sports med for further workup. Return in about 2 months (around 03/14/2024) for mood and ADD, med changes.    Nani Gasser, MD

## 2024-03-16 ENCOUNTER — Ambulatory Visit: Payer: Commercial Managed Care - PPO | Admitting: Family Medicine

## 2024-03-16 DIAGNOSIS — F902 Attention-deficit hyperactivity disorder, combined type: Secondary | ICD-10-CM

## 2024-04-11 ENCOUNTER — Other Ambulatory Visit: Payer: Self-pay | Admitting: Family Medicine

## 2024-04-11 DIAGNOSIS — F411 Generalized anxiety disorder: Secondary | ICD-10-CM
